# Patient Record
Sex: Male | Born: 1965 | Race: Black or African American | Hispanic: No | Marital: Single | Smoking: Current every day smoker
Health system: Southern US, Community
[De-identification: ages and names within clinical notes are randomized; demographics above are authoritative.]

---

## 2013-03-27 ENCOUNTER — Encounter (HOSPITAL_COMMUNITY): Payer: Self-pay

## 2013-03-27 ENCOUNTER — Emergency Department (INDEPENDENT_AMBULATORY_CARE_PROVIDER_SITE_OTHER)
Admission: EM | Admit: 2013-03-27 | Discharge: 2013-03-27 | Disposition: A | Discharge status: Home / Self Care | Payer: Self-pay | Source: Home / Self Care | Attending: Family Medicine | Admitting: Family Medicine

## 2013-03-27 DIAGNOSIS — R040 Epistaxis: Secondary | ICD-10-CM | POA: Diagnosis present

## 2013-03-27 DIAGNOSIS — J309 Allergic rhinitis, unspecified: Secondary | ICD-10-CM | POA: Diagnosis present

## 2013-03-27 MED ORDER — CETIRIZINE HCL 10 MG PO TABS: 10.0000 mg | ORAL_TABLET | Freq: Every day | ORAL | Status: DC

## 2013-03-27 MED ORDER — SODIUM CHLORIDE 0.65 % NA SOLN: 2.0000 | NASAL | Status: DC | PRN

## 2013-03-27 NOTE — ED Provider Notes (Signed)
History     CSN: [id number]  Arrival date & time 03/27/13  1222   First MD Initiated Contact with Patient 03/27/13 1409      Chief Complaint  Patient presents with  . Epistaxis    (Consider location/radiation/quality/duration/timing/severity/associated sxs/prior treatment) HPI Pt presenting today reporting that he is having nose bleeds.  He says that he has had 2 other episodes.  Pt says that it happened last month.  He says that he had a nose bleed this morning.  Pt says that he was moving some furniture around when it happened.    History reviewed. No pertinent past medical history.  History reviewed. No pertinent past surgical history.  FH Mother had HTN, or DM   History  Substance Use Topics  . Smoking status: Not on file  . Smokeless tobacco: Not on file  . Alcohol Use: Not on file    Review of Systems Constitutional: Negative.  HENT: nose bleeding  Respiratory: Negative.  Cardiovascular: Negative.  Gastrointestinal: Negative.  Endocrine: Negative.  Genitourinary: Negative.  Musculoskeletal: Negative.  Skin: Negative.  Allergic/Immunologic: Negative.  Neurological: Negative.  Hematological: Negative.  Psychiatric/Behavioral: Negative.  All other systems reviewed and are negative   Allergies  Review of patient's allergies indicates no known allergies.  Home Medications  No current outpatient prescriptions on file.  BP 113/63  Pulse 69  Temp(Src) 97.9 F (36.6 C) (Oral)  Resp 17  SpO2 99%  Physical Exam Nursing note and vitals reviewed.  Constitutional: He is oriented to person, place, and time. He appears well-developed and well-nourished. No distress.  Eyes: Conjunctivae and EOM are normal. Pupils are equal, round, and reactive to light.  Neck: Normal range of motion. Neck supple. No JVD present. No thyromegaly present.  Cardiovascular: Normal rate, regular rhythm and normal heart sounds.  No murmur heard.  Pulmonary/Chest: Effort normal and  breath sounds normal. No respiratory distress.  Abdominal: Soft. Bowel sounds are normal.  Musculoskeletal: Normal range of motion. He exhibits no edema.  Lymphadenopathy:  He has no cervical adenopathy.  Neurological: He is oriented to person, place, and time. Coordination normal.  Skin: Skin is warm and dry. No rash noted. No erythema. No pallor.  Psychiatric: He has a normal mood and affect. His behavior is normal. Judgment and thought content normal.   ED Course  Procedures (including critical care time)  Labs Reviewed - No data to display No results found.   No diagnosis found.   MDM  IMPRESSION  Epistaxis  Allergic rhinitis  RECOMMENDATIONS / PLAN Trial of saline nasal spray bid to nares Zyrtec 10 mg po daily  RTC if persists.  FOLLOW UP As needed   The patient was given clear instructions to go to ER or return to medical center if symptoms don't improve, worsen or new problems develop.  The patient verbalized understanding.  The patient was told to call to get lab results if they haven't heard anything in the next week.            Cleora Fleet, MD 03/27/13 1416

## 2013-03-27 NOTE — ED Notes (Signed)
Patient here for chronic nose bleeds Has had two prior to this one Denies having allergies

## 2013-06-18 ENCOUNTER — Other Ambulatory Visit: Payer: Self-pay | Admitting: Internal Medicine

## 2013-06-18 ENCOUNTER — Ambulatory Visit: Payer: No Typology Code available for payment source | Attending: Family Medicine | Admitting: Internal Medicine

## 2013-06-18 ENCOUNTER — Ambulatory Visit
Admission: RE | Admit: 2013-06-18 | Discharge: 2013-06-18 | Disposition: A | Discharge status: Home / Self Care | Payer: Self-pay | Source: Ambulatory Visit | Attending: Internal Medicine | Admitting: Internal Medicine

## 2013-06-18 VITALS — BP 107/68 | HR 70 | Temp 98.6°F | Resp 18 | Wt 188.0 lb

## 2013-06-18 DIAGNOSIS — K4090 Unilateral inguinal hernia, without obstruction or gangrene, not specified as recurrent: Secondary | ICD-10-CM

## 2013-06-18 NOTE — Progress Notes (Signed)
Patient here for hernia near his groin

## 2013-06-18 NOTE — Patient Instructions (Signed)
Did not lift anything more than 25 pounds

## 2013-06-18 NOTE — Progress Notes (Signed)
Patient ID: Timothy Buckley, male   DOB: 1966-09-23, 47 y.o.   MRN: [id number]  CC:  HPI: 47 year old male who presents with right groin pain. The patient describes the discomfort as being sharp as if somebody jabbing a pin  in his groin area, the discomfort is intermittent, Patient works as a Electronics engineer. He denies any fever, dysuria, urgency frequency     No Known Allergies History reviewed. No pertinent past medical history. Current Outpatient Prescriptions on File Prior to Visit  Medication Sig Dispense Refill  . cetirizine (ZYRTEC) 10 MG tablet Take 1 tablet (10 mg total) by mouth daily.  30 tablet  2  . sodium chloride (OCEAN) 0.65 % nasal spray Place 2 sprays into the nose as needed (dry nasal cavity).  15 mL  5   No current facility-administered medications on file prior to visit.   History reviewed. No pertinent family history. History   Social History  . Marital Status: Single    Spouse Name: N/A    Number of Children: N/A  . Years of Education: N/A   Occupational History  . Not on file.   Social History Main Topics  . Smoking status: Not on file  . Smokeless tobacco: Not on file  . Alcohol Use: Not on file  . Drug Use: Not on file  . Sexually Active: Not on file   Other Topics Concern  . Not on file   Social History Narrative  . No narrative on file    Review of Systems  Constitutional: Negative for fever, chills, diaphoresis, activity change, appetite change and fatigue.  HENT: Negative for ear pain, nosebleeds, congestion, facial swelling, rhinorrhea, neck pain, neck stiffness and ear discharge.   Eyes: Negative for pain, discharge, redness, itching and visual disturbance.  Respiratory: Negative for cough, choking, chest tightness, shortness of breath, wheezing and stridor.   Cardiovascular: Negative for chest pain, palpitations and leg swelling.  Gastrointestinal: Negative for abdominal distention.  Genitourinary: Negative for  dysuria, urgency, frequency, hematuria, flank pain, decreased urine volume, difficulty urinating and dyspareunia.  Musculoskeletal: Negative for back pain, joint swelling, arthralgias and gait problem.  Neurological: Negative for dizziness, tremors, seizures, syncope, facial asymmetry, speech difficulty, weakness, light-headedness, numbness and headaches.  Hematological: Negative for adenopathy. Does not bruise/bleed easily.  Psychiatric/Behavioral: Negative for hallucinations, behavioral problems, confusion, dysphoric mood, decreased concentration and agitation.    Objective:   Filed Vitals:   06/18/13 1201  BP: 107/68  Pulse: 70  Temp: 98.6 F (37 C)  Resp: 18    Physical Exam  Constitutional: Appears well-developed and well-nourished. No distress.  HENT: Normocephalic. External right and left ear normal. Oropharynx is clear and moist.  Eyes: Conjunctivae and EOM are normal. PERRLA, no scleral icterus.  Neck: Normal ROM. Neck supple. No JVD. No tracheal deviation. No thyromegaly.  CVS: RRR, S1/S2 +, no murmurs, no gallops, no carotid bruit.  Pulmonary: Effort and breath sounds normal, no stridor, rhonchi, wheezes, rales.  Abdominal: Soft. BS +,  no distension, tenderness, rebound or guarding.  Musculoskeletal: Normal range of motion. No edema and no tenderness.  Lymphadenopathy: No lymphadenopathy noted, cervical, inguinal. Neuro: Alert. Normal reflexes, muscle tone coordination. No cranial nerve deficit. Skin: Skin is warm and dry. No rash noted. Not diaphoretic. No erythema. No pallor.  Psychiatric: Normal mood and affect. Behavior, judgment, thought content normal.   No results found for this basename: WBC, HGB, HCT, MCV, PLT   No results found for this basename:  CREATININE, BUN, NA, K, CL, CO2    No results found for this basename: HGBA1C   Lipid Panel  No results found for this basename: chol, trig, hdl, cholhdl, vldl, ldlcalc       Assessment and plan:   Patient  Active Problem List   Diagnosis Date Noted  . Allergic rhinitis 03/27/2013  . Epistaxis, recurrent 03/27/2013       Inguinal hernia Surgical referral Ultrasound of inguinal  area Limitation on lifting weights more than 25 pounds Patient is waiting for his orange card I have recommended that he work with Arna Medici for his surgical appointment We'll also check a UA No further followup needed unless the patient develops acute pain and this was explained to the patient

## 2013-06-19 LAB — URINALYSIS
Bilirubin Urine: NEGATIVE
Glucose, UA: NEGATIVE mg/dL
Ketones, ur: NEGATIVE mg/dL
Specific Gravity, Urine: 1.03 (ref 1.005–1.030)

## 2013-06-26 ENCOUNTER — Ambulatory Visit (INDEPENDENT_AMBULATORY_CARE_PROVIDER_SITE_OTHER): Payer: Self-pay | Admitting: Surgery

## 2013-06-26 ENCOUNTER — Ambulatory Visit: Payer: Self-pay

## 2013-07-07 ENCOUNTER — Ambulatory Visit (INDEPENDENT_AMBULATORY_CARE_PROVIDER_SITE_OTHER): Payer: Self-pay | Admitting: Surgery

## 2013-07-09 ENCOUNTER — Ambulatory Visit (INDEPENDENT_AMBULATORY_CARE_PROVIDER_SITE_OTHER): Payer: Self-pay | Admitting: Surgery

## 2013-07-09 ENCOUNTER — Encounter (INDEPENDENT_AMBULATORY_CARE_PROVIDER_SITE_OTHER): Payer: Self-pay | Admitting: Surgery

## 2013-07-09 VITALS — BP 124/72 | HR 68 | Temp 98.2°F | Resp 14 | Ht 69.0 in | Wt 192.2 lb

## 2013-07-09 DIAGNOSIS — K429 Umbilical hernia without obstruction or gangrene: Secondary | ICD-10-CM

## 2013-07-09 DIAGNOSIS — K4090 Unilateral inguinal hernia, without obstruction or gangrene, not specified as recurrent: Secondary | ICD-10-CM

## 2013-07-09 NOTE — Progress Notes (Signed)
Patient ID: Timothy Buckley, male   DOB: 1966/04/01, 47 y.o.   MRN: [id number]  Chief Complaint  Patient presents with  . New Evaluation    eval ing hernia on right    HPI Timothy Buckley is a 47 y.o. male.  Referred by  Dr. Susie Cassette for evaluation of right inguinal hernia HPI This is a 47 year old male who presents with a five-year history of a palpable bulge in his right groin. This initially was noticed when he was incarcerated and was doing a lot of weight lifting. He remains reducible until the last year. Has become more prominent and extends down into the scrotum. It causes some discomfort. Occasionally he does have some difficulty with urination.  Currently he is living in a group home and claims to have given up smoking, alcohol, and other drugs. Due to the increasing discomfort he is now referred for surgical evaluation.   History reviewed. No pertinent past medical history.  History reviewed. No pertinent past surgical history.  Family History  Problem Relation Age of Onset  . Diabetes Mother   . Heart disease Mother   . Hypertension Mother     Social History History  Substance Use Topics  . Smoking status: Former Smoker    Quit date: 01/08/2013  . Smokeless tobacco: Never Used  . Alcohol Use: No    No Known Allergies  No current outpatient prescriptions on file.   No current facility-administered medications for this visit.    Review of Systems Review of Systems  Constitutional: Negative for fever, chills and unexpected weight change.  HENT: Negative for hearing loss, congestion, sore throat, trouble swallowing and voice change.   Eyes: Negative for visual disturbance.  Respiratory: Negative for cough and wheezing.   Cardiovascular: Negative for chest pain, palpitations and leg swelling.  Gastrointestinal: Negative for nausea, vomiting, abdominal pain, diarrhea, constipation, blood in stool, abdominal distention, anal bleeding and rectal pain.  Genitourinary:  Positive for urgency, scrotal swelling and testicular pain. Negative for hematuria and difficulty urinating.  Musculoskeletal: Negative for arthralgias.  Skin: Negative for rash and wound.  Neurological: Negative for seizures, syncope, weakness and headaches.  Hematological: Negative for adenopathy. Does not bruise/bleed easily.  Psychiatric/Behavioral: Negative for confusion.    Blood pressure 124/72, pulse 68, temperature 98.2 F (36.8 C), temperature source Temporal, resp. rate 14, height 5\' 9"  (1.753 m), weight 192 lb 3.2 oz (87.181 kg).  Physical Exam Physical Exam WDWN in NAD HEENT:  EOMI, sclera anicteric Neck:  No masses, no thyromegaly Lungs:  CTA bilaterally; normal respiratory effort CV:  Regular rate and rhythm; no murmurs Abd:  +bowel sounds, soft, non-tender, protruding reducible umbilical hernia GU:  Bilateral descended testes; no testicular masses; large right scrotal hernia; some laxity in left groin but no obvious left inguinal hernia Ext:  Well-perfused; no edema Skin:  Warm, dry; no sign of jaundice  Data Reviewed none  Assessment    Right inguinal hernia - partially reducible Umbilical hernia reducible     Plan    Right inguinal hernia repair with mesh/ umbilical hernia repair with mesh.  The surgical procedure has been discussed with the patient.  Potential risks, benefits, alternative treatments, and expected outcomes have been explained.  All of the patient's questions at this time have been answered.  The likelihood of reaching the patient's treatment goal is good.  The patient understand the proposed surgical procedure and wishes to proceed.         Timothy Buckley K. 07/09/2013, 12:38  PM

## 2013-07-15 ENCOUNTER — Telehealth (INDEPENDENT_AMBULATORY_CARE_PROVIDER_SITE_OTHER): Payer: Self-pay | Admitting: Surgery

## 2013-07-15 NOTE — Telephone Encounter (Signed)
Advised patient and Mr. Timothy Buckley of financial responsibility for surgical procedure, per patient they will call back to schedule.

## 2013-07-30 ENCOUNTER — Emergency Department (INDEPENDENT_AMBULATORY_CARE_PROVIDER_SITE_OTHER)
Admission: EM | Admit: 2013-07-30 | Discharge: 2013-07-30 | Disposition: A | Discharge status: Home / Self Care | Payer: No Typology Code available for payment source | Source: Home / Self Care | Attending: Emergency Medicine | Admitting: Emergency Medicine

## 2013-07-30 ENCOUNTER — Encounter (HOSPITAL_COMMUNITY): Payer: Self-pay | Admitting: Emergency Medicine

## 2013-07-30 DIAGNOSIS — D369 Benign neoplasm, unspecified site: Secondary | ICD-10-CM

## 2013-07-30 NOTE — ED Notes (Signed)
Patient asked about work note, located note for patient

## 2013-07-30 NOTE — ED Notes (Signed)
C/o intermittent nose bleed.  Patient is staying at [facility] house.  No known injury.  Nose bleeds for 3 weeks

## 2013-07-30 NOTE — ED Provider Notes (Signed)
Chief Complaint:   Chief Complaint  Patient presents with  . Epistaxis    History of Present Illness:   Timothy Buckley is a 47 year old male who has had a three-week history of a sensation of swelling, intermittent bleeding, and soreness in the right nostril. He denies any trauma to the area. The nose has felt stopped up. It's been oozing some blood and serous fluid. He's had no heavy bleeding. He denies any fever, chills, headache, sore throat, intraoral lesions, adenopathy, or cough.  Review of Systems:  Other than noted above, the patient denies any of the following symptoms: Systemic:  No fevers, chills, sweats, weight loss or gain, fatigue, or tiredness. Eye:  No redness, pain, discharge, itching, blurred vision, or diplopia. ENT:  No headache, nasal congestion, sneezing, itching, epistaxis, ear pain, congestion, decreased hearing, ringing in ears, vertigo, or tinnitus.  No oral lesions, sore throat, pain on swallowing, or hoarseness. Neck:  No mass, tenderness or adenopathy. Lungs:  No coughing, wheezing, or shortness of breath. Skin:  No rash or itching.  PMFSH:  Past medical history, family history, social history, meds, and allergies were reviewed.   Physical Exam:   Vital signs:  BP 119/74  Pulse 90  Temp(Src) 97.8 F (36.6 C) (Oral)  Resp 16  SpO2 98% General:  Alert and oriented.  In no distress.  Skin warm and dry. Eye:  PERRL, full EOMs, lids and conjunctiva normal.   ENT:  TMs and canals clear.  There is a large, reddish-brown mass in the right nostril, occluding the entire nostril. The surface was friable and oozing a small amount of blood, this was not bleeding heavily.  Mucous membranes moist, no oral lesions, normal dentition, pharynx clear.  No cranial or facial pain to palplation. Neck:  Supple, full ROM.  No adenopathy, tenderness or mass.  Thyroid normal. Lungs:  Breath sounds clear and equal bilaterally.  No wheezes, rales or rhonchi. Heart:  Rhythm regular,  without extrasystoles.  No gallops or murmers. Skin:  Clear, warm and dry.  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 He needs to be evaluated by ENT as soon as possible. Dr. Pollyann Kennedy in his her on-call physician for ENT for today. I called his office and fortunately were able to see him right away. He was sent over to Dr. Lucky Rathke office for immediate followup.  Plan:   1.  The following meds were prescribed:  There are no discharge medications for this patient.  2.  The patient was instructed in symptomatic care and handouts were given. 3.  The patient was told to return if becoming worse in any way, if no better in 3 or 4 days, and given some red flag symptoms such as heavy bleeding that would indicate earlier return. 4.  Follow up with Dr. Pollyann Kennedy as soon as possible.    Reuben Likes, MD 07/30/13 [phone]

## 2013-08-19 ENCOUNTER — Ambulatory Visit: Payer: Self-pay

## 2015-04-06 ENCOUNTER — Ambulatory Visit: Payer: Self-pay | Admitting: Infectious Diseases

## 2015-08-15 ENCOUNTER — Emergency Department (HOSPITAL_COMMUNITY): Payer: Self-pay

## 2015-08-15 ENCOUNTER — Encounter (HOSPITAL_COMMUNITY): Payer: Self-pay | Admitting: Emergency Medicine

## 2015-08-15 ENCOUNTER — Emergency Department (HOSPITAL_COMMUNITY)
Admission: EM | Admit: 2015-08-15 | Discharge: 2015-08-15 | Disposition: A | Discharge status: Home / Self Care | Payer: Self-pay | Attending: Emergency Medicine | Admitting: Emergency Medicine

## 2015-08-15 DIAGNOSIS — J069 Acute upper respiratory infection, unspecified: Secondary | ICD-10-CM | POA: Insufficient documentation

## 2015-08-15 DIAGNOSIS — M25511 Pain in right shoulder: Secondary | ICD-10-CM | POA: Insufficient documentation

## 2015-08-15 DIAGNOSIS — R911 Solitary pulmonary nodule: Secondary | ICD-10-CM | POA: Insufficient documentation

## 2015-08-15 DIAGNOSIS — Z720 Tobacco use: Secondary | ICD-10-CM | POA: Insufficient documentation

## 2015-08-15 MED ORDER — GUAIFENESIN-CODEINE 100-10 MG/5ML PO SOLN: 5.0000 mL | Freq: Four times a day (QID) | ORAL | Status: AC | PRN

## 2015-08-15 MED ORDER — NAPROXEN 375 MG PO TABS: 375.0000 mg | ORAL_TABLET | Freq: Two times a day (BID) | ORAL | Status: AC

## 2015-08-15 NOTE — ED Notes (Signed)
Pt arrives POV from home with right shoulder pain with movement. Denies recent injury. 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 Reports occasional chest pains with coughing. Denies recent fever. Vss.

## 2015-08-15 NOTE — ED Provider Notes (Signed)
CSN: [id number]     Arrival date & time 08/15/15  3267 History   First MD Initiated Contact with Patient 08/15/15 1037     Chief Complaint  Patient presents with  . Arm Pain  . Cough     (Consider location/radiation/quality/duration/timing/severity/associated sxs/prior Treatment) HPI  Timothy Buckley Is a 49 year old male who presents emergency Department with 2 chief complaints.  1. Patient complains of cough which began 3 days ago. He complains of pain in the upper chest with cough only. He has mild production of clear phlegm. He denies shortness of breath, chest pressure, diaphoresis, dyspnea on exertion or other symptoms of acute coronary syndrome. He denies hemoptysis. His girlfriend who is present at the same symptoms 3 weeks ago. The patient is a current daily smoker. He has not tried any medications to treat his cough. 2. Patient complains of right shoulder pain which has been present for greater than one month. Patient moves furniture for a living. He denies any previous injuries. He complains of pain with flexion and abduction of the right shoulder. It has progressively worsened over the past month. It occasionally wakes him from sleep. He is having difficulty performing his work activities. He denies any numbness or tingling in his hand. He has not tried any medications to treat his pain.   History reviewed. No pertinent past medical history. History reviewed. No pertinent past surgical history. Family History  Problem Relation Age of Onset  . Diabetes Mother   . Heart disease Mother   . Hypertension Mother    Social History  Substance Use Topics  . Smoking status: Former Smoker    Quit date: 01/08/2013  . Smokeless tobacco: Never Used  . Alcohol Use: No    Review of Systems  Ten systems reviewed and are negative for acute change, except as noted in the HPI.    Allergies  Review of patient's allergies indicates no known allergies.  Home Medications   Prior to  Admission medications   Not on File   BP 122/89 mmHg  Pulse 69  Temp(Src) 97.7 F (36.5 C) (Oral)  Resp 17  Ht 5\' 9"  (1.753 m)  Wt 185 lb (83.915 kg)  BMI 27.31 kg/m2  SpO2 98% Physical Exam  Constitutional: He is oriented to person, place, and time. He appears well-developed and well-nourished. No distress.  HENT:  Head: Normocephalic and atraumatic.  Eyes: Conjunctivae are normal. No scleral icterus.  Neck: Normal range of motion. Neck supple.  Cardiovascular: Normal rate, regular rhythm and normal heart sounds.   Pulmonary/Chest: Effort normal and breath sounds normal. No respiratory distress.  Abdominal: Soft. There is no tenderness.  Musculoskeletal: He exhibits no edema.  Pain with active range of motion with abduction and hyperflexion of the right shoulder. Patient unable to relax enough to let me perform passive range of motion. He is equal and strong grip strength bilaterally. Radial pulses are 2+ bilaterally with normal sensation. No bony tenderness.  Neurological: He is alert and oriented to person, place, and time.  Skin: Skin is warm and dry. He is not diaphoretic.  Psychiatric: His behavior is normal.  Nursing note and vitals reviewed.   ED Course  Procedures (including critical care time) Labs Review Labs Reviewed - No data to display  Imaging Review No results found. I have personally reviewed and evaluated these images and lab results as part of my medical decision-making.   EKG Interpretation   Date/Time:  Monday August 15 2015 10:12:59 EDT Ventricular Rate:  65 PR Interval:  170 QRS Duration: 88 QT Interval:  396 QTC Calculation: 411 R Axis:   -66 Text Interpretation:  Normal sinus rhythm Left axis deviation Cannot rule  out Anterior infarct , age undetermined Abnormal ECG Confirmed by [facility]  MD, Corene Cornea [phone]) on 08/15/2015 10:47:48 AM      MDM   Final diagnoses:  Right shoulder pain  URI (upper respiratory infection)  Incidental pulmonary  nodule, > 37mm and < 49mm  Tobacco abuse    10:59 AM BP 122/89 mmHg  Pulse 69  Temp(Src) 97.7 F (36.5 C) (Oral)  Resp 17  Ht 5\' 9"  (1.753 m)  Wt 185 lb (83.915 kg)  BMI 27.31 kg/m2  SpO2 98% Patient here with complaint of chest pain with cough and right shoulder pain. EKG was obtained at triage prior to my medical decision-making. I do not feel EKG was a necessary component of this patient's workup. Given his complaint of chest pain with cough as no concern for acute coronary syndrome. However, the patient's EKG does show some ST depression in V1 and V2. I have discussed this with Dr. Dayna Barker feels we will need a repeat EKG during this visit.  Patient EKG repeated and has improved. Patient shoulder x-ray negative. For this likely represents a tendinitis or other soft tissue injury of the shoulder, likely from overuse. Patient will be referred to orthopedics for further evaluation, treatment with rice protocol, and anti-inflammatory medications. Patient chest x-ray negative except for incidental 7 mm pulmonary nodule. Discussed this with the patient and recommended repeat chest CT in 6 months. Patient aware of the findings. He appears safe for discharge at this time. Pt CXR negative for acute infiltrate. Patients symptoms are consistent with URI, likely viral etiology. Discussed that antibiotics are not indicated for viral infections. Pt will be discharged with symptomatic treatment.  Verbalizes understanding and is agreeable with plan. Pt is hemodynamically stable & in NAD prior to dc.       Margarita Mail, PA-C 08/16/15 2225  Merrily Pew, MD 08/18/15 [phone]

## 2015-08-15 NOTE — Discharge Instructions (Signed)
your chest x-ray was negative except for an incidental finding of a 7 mm pulmonary nodule. Most of these are not dangerous. However, because you smoke. You will need a repeat chest CT scan in 6 months to make sure that it is not changing or growing. [facility] for further management of her symptoms. I have given you a referral to orthopedics. Please take the medication. I am giving her as prescribed. Ice 3-4 times daily as we discussed. He may use her sling for comfort. He will need to take her arm out and move it several times daily.   Pulmonary Nodule A pulmonary nodule is a small, round growth of tissue in the lung. Pulmonary nodules can range in size from less than 1/5 inch (4 mm) to a little bigger than an inch (25 mm). Most pulmonary nodules are detected when imaging tests of the lung are being performed for a different problem. Pulmonary nodules are usually not cancerous (benign). However, some pulmonary nodules are cancerous (malignant). Follow-up treatment or testing is based on the size of the pulmonary nodule and your risk of getting lung cancer.  CAUSES Benign pulmonary nodules can be caused by various things. Some of the causes include:   Bacterial, fungal, or viral infections. This is usually an old infection that is no longer active, but it can sometimes be a current, active infection.  A benign mass of tissue.  Inflammation from conditions such as rheumatoid arthritis.   Abnormal blood vessels in the lungs. Malignant pulmonary nodules can result from lung cancer or from cancers that spread to the lung from other places in the body. SIGNS AND SYMPTOMS Pulmonary nodules usually do not cause symptoms. DIAGNOSIS Most often, pulmonary nodules are found incidentally when an X-ray or CT scan is performed to look for some other problem in the lung area. To help determine whether a pulmonary nodule is benign or malignant, your  health care provider will take a medical history and order a variety of tests. Tests done may include:   Blood tests.  A skin test called a tuberculin test. This test is used to determine if you have been exposed to the germ that causes tuberculosis.   Chest X-rays. If possible, a new X-ray may be compared with X-rays you have had in the past.   CT scan. This test shows smaller pulmonary nodules more clearly than an X-ray.   Positron emission tomography (PET) scan. In this test, a safe amount of a radioactive substance is injected into the bloodstream. Then, the scan takes a picture of the pulmonary nodule. The radioactive substance is eliminated from your body in your urine.   Biopsy. A tiny piece of the pulmonary nodule is removed so it can be checked under a microscope. TREATMENT  Pulmonary nodules that are benign normally do not require any treatment because they usually do not cause symptoms or breathing problems. Your health care provider may want to monitor the pulmonary nodule through follow-up CT scans. The frequency of these CT scans will vary based on the size of the nodule and the risk factors for lung cancer. For example, CT scans will need to be done more frequently if the pulmonary nodule is larger and if you have a history of smoking and a family history of cancer. Further testing or biopsies may be done if any follow-up CT scan shows that the size of the pulmonary nodule has increased. HOME CARE INSTRUCTIONS  Only take over-the-counter or prescription medicines as directed by your health care provider.  Keep all follow-up appointments with your health care provider. SEEK MEDICAL CARE IF:  You have trouble breathing when you are active.   You feel sick or unusually tired.   You do not feel like eating.   You lose weight without trying to.   You develop chills or night sweats.  SEEK IMMEDIATE MEDICAL CARE IF:  You cannot catch your breath, or you begin  wheezing.   You cannot stop coughing.   You cough up blood.   You become dizzy or feel like you are going to pass out.   You have sudden chest pain.   You have a fever or persistent symptoms for more than 2-3 days.   You have a fever and your symptoms suddenly get worse. MAKE SURE YOU:  Understand these instructions.  Will watch your condition.  Will get help right away if you are not doing well or get worse. Document Released: 09/30/2009 Document Revised: 08/05/2013 Document Reviewed: 05/25/2013 [facility] Patient Information 2015 [place], [place]. This information is not intended to replace advice given to you by your health care provider. Make sure you discuss any questions you have with your health care provider.  Shoulder Pain The shoulder is the joint that connects your arms to your body. The bones that form the shoulder joint include the upper arm bone (humerus), the shoulder blade (scapula), and the collarbone (clavicle). The top of the humerus is shaped like a ball and fits into a rather flat socket on the scapula (glenoid cavity). A combination of muscles and strong, fibrous tissues that connect muscles to bones (tendons) support your shoulder joint and hold the ball in the socket. Small, fluid-filled sacs (bursae) are located in different areas of the joint. They act as cushions between the bones and the overlying soft tissues and help reduce friction between the gliding tendons and the bone as you move your arm. Your shoulder joint allows a wide range of motion in your arm. This range of motion allows you to do things like scratch your back or throw a ball. However, this range of motion also makes your shoulder more prone to pain from overuse and injury. Causes of shoulder pain can originate from both injury and overuse and usually can be grouped in the following four categories:  Redness, swelling, and pain (inflammation) of the tendon (tendinitis) or the bursae  (bursitis).  Instability, such as a dislocation of the joint.  Inflammation of the joint (arthritis).  Broken bone (fracture). HOME CARE INSTRUCTIONS   Apply ice to the sore area.  Put ice in a plastic bag.  Place a towel between your skin and the bag.  Leave the ice on for 15-20 minutes, 3-4 times per day for the first 2 days, or as directed by your health care provider.  Stop using cold packs if they do not help with the pain.  If you have a shoulder sling or immobilizer, wear it as long as your caregiver instructs. Only remove it to shower or bathe. Move your arm as little as possible, but keep your hand moving to prevent swelling.  Squeeze a soft ball or foam pad as much as possible to help prevent swelling.  Only take over-the-counter or prescription medicines for pain, discomfort, or fever as directed by your caregiver. SEEK MEDICAL CARE IF:   Your shoulder pain increases, or new pain develops in your arm, hand, or fingers.  Your hand  or fingers become cold and numb.  Your pain is not relieved with medicines. SEEK IMMEDIATE MEDICAL CARE IF:   Your arm, hand, or fingers are numb or tingling.  Your arm, hand, or fingers are significantly swollen or turn white or blue. MAKE SURE YOU:   Understand these instructions.  Will watch your condition.  Will get help right away if you are not doing well or get worse. Document Released: 09/12/2005 Document Revised: 04/19/2014 Document Reviewed: 11/17/2011 [facility] Patient Information 2015 [place], [place]. This information is not intended to replace advice given to you by your health care provider. Make sure you discuss any questions you have with your health care provider.  Shoulder Range of Motion Exercises The shoulder is the most flexible joint in the human body. Because of this it is also the most unstable joint in the body. All ages can develop shoulder problems. Early treatment of problems is necessary for a good  outcome. People react to shoulder pain by decreasing the movement of the joint. After a brief period of time, the shoulder can become "frozen". This is an almost complete loss of the ability to move the damaged shoulder. Following injuries your caregivers can give you instructions on exercises to keep your range of motion (ability to move your shoulder freely), or regain it if it has been lost.  [place]: Codman's Exercise or Pendulum Exercise  This exercise may be performed in a prone (face-down) lying position or standing while leaning on a chair with the opposite arm. Its purpose is to relax the muscles in your shoulder and slowly but surely increase the range of motion and to relieve pain.  Lie on your stomach close to the side edge of the bed. Let your weak arm hang over the edge of the bed. Relax your shoulder, arm and hand. Let your shoulder blade relax and drop down.  Slowly and gently swing your arm forward and back. Do not use your neck muscles; relax them. It might be easier to have someone else gently start swinging your arm.  As pain decreases, increase your swing. To start, arm swing should begin at 15 degree angles. In time and as pain lessens, move to 30-45 degree angles. Start with swinging for about 15 seconds, and work towards swinging for 3 to 5 minutes.  This exercise may also be performed in a standing/bent over position.  Stand and hold onto a sturdy chair with your good arm. Bend forward at the waist and bend your knees slightly to help protect your back. Relax your weak arm, let it hang limp. Relax your shoulder blade and let it drop.  Keep your shoulder relaxed and use body motion to swing your arm in small circles.  Stand up tall and relax.  Repeat motion and change direction of circles.  Start with swinging for about 30 seconds, and work towards swinging for 3 to 5 minutes. STRETCHING EXERCISES:  Lift your arm  out in front of you with the elbow bent at 90 degrees. Using your other arm gently pull the elbow forward and across your body.  Bend one arm behind you with the palm facing outward. Using the other arm, hold a towel or rope and reach this arm up above your head, then bend it at the elbow to move your wrist to behind your neck. Grab the free end of the towel with the hand behind your back. Gently pull the towel up with the  hand behind your neck, gradually increasing the pull on the hand behind the small of your back. Then, gradually pull down with the hand behind the small of your back. This will pull the hand and arm behind your neck further. Both shoulders will have an increased range of motion with repetition of this exercise. STRENGTHENING EXERCISES:  Standing with your arm at your side and straight out from your shoulder with the elbow bent at 90 degrees, hold onto a small weight and slowly raise your hand so it points straight up in the air. Repeat this five times to begin with, and gradually increase to ten times. Do this four times per day. As you grow stronger you can gradually increase the weight.  Repeat the above exercise, only this time using an elastic band. Start with your hand up in the air and pull down until your hand is by your side. As you grow stronger, gradually increase the amount you pull by increasing the number or size of the elastic bands. Use the same amount of repetitions.  Standing with your hand at your side and holding onto a weight, gradually lift the hand in front of you until it is over your head. Do the same also with the hand remaining at your side and lift the hand away from your body until it is again over your head. Repeat this five times to begin with, and gradually increase to ten times. Do this four times per day. As you grow stronger you can gradually increase the weight. Document Released: 09/01/2003 Document Revised: 12/08/2013 Document Reviewed:  12/03/2005 [facility] Patient Information 2015 [place], [place]. This information is not intended to replace advice given to you by your health care provider. Make sure you discuss any questions you have with your health care provider.  Smoking Cessation, Tips for Success If you are ready to quit smoking, congratulations! You have chosen to help yourself be healthier. Cigarettes bring nicotine, tar, carbon monoxide, and other irritants into your body. Your lungs, heart, and blood vessels will be able to work better without these poisons. There are many different ways to quit smoking. Nicotine gum, nicotine patches, a nicotine inhaler, or nicotine nasal spray can help with physical craving. Hypnosis, support groups, and medicines help break the habit of smoking. WHAT THINGS CAN I DO TO MAKE QUITTING EASIER?  Here are some tips to help you quit for good:  Pick a date when you will quit smoking completely. Tell all of your friends and family about your plan to quit on that date.  Do not try to slowly cut down on the number of cigarettes you are smoking. Pick a quit date and quit smoking completely starting on that day.  Throw away all cigarettes.   Clean and remove all ashtrays from your home, work, and car.  On a card, write down your reasons for quitting. Carry the card with you and read it when you get the urge to smoke.  Cleanse your body of nicotine. Drink enough water and fluids to keep your urine clear or pale yellow. Do this after quitting to flush the nicotine from your body.  Learn to predict your moods. Do not let a bad situation be your excuse to have a cigarette. Some situations in your life might tempt you into wanting a cigarette.  Never have "just one" cigarette. It leads to wanting another and another. Remind yourself of your decision to quit.  Change habits associated with smoking. If you smoked while driving  or when feeling stressed, try other activities to replace smoking.  Stand up when drinking your coffee. Brush your teeth after eating. Sit in a different chair when you read the paper. Avoid alcohol while trying to quit, and try to drink fewer caffeinated beverages. Alcohol and caffeine may urge you to smoke.  Avoid foods and drinks that can trigger a desire to smoke, such as sugary or spicy foods and alcohol.  Ask people who smoke not to smoke around you.  Have something planned to do right after eating or having a cup of coffee. For example, plan to take a walk or exercise.  Try a relaxation exercise to calm you down and decrease your stress. Remember, you may be tense and nervous for the first 2 weeks after you quit, but this will pass.  Find new activities to keep your hands busy. Play with a pen, coin, or rubber band. Doodle or draw things on paper.  Brush your teeth right after eating. This will help cut down on the craving for the taste of tobacco after meals. You can also try mouthwash.   Use oral substitutes in place of cigarettes. Try using lemon drops, carrots, cinnamon sticks, or chewing gum. Keep them handy so they are available when you have the urge to smoke.  When you have the urge to smoke, try deep breathing.  Designate your home as a nonsmoking area.  If you are a heavy smoker, ask your health care provider about a prescription for nicotine chewing gum. It can ease your withdrawal from nicotine.  Reward yourself. Set aside the cigarette money you save and buy yourself something nice.  Look for support from others. Join a support group or smoking cessation program. Ask someone at home or at work to help you with your plan to quit smoking.  Always ask yourself, "Do I need this cigarette or is this just a reflex?" Tell yourself, "Today, I choose not to smoke," or "I do not want to smoke." You are reminding yourself of your decision to quit.  Do not replace cigarette smoking with electronic cigarettes (commonly called e-cigarettes). The  safety of e-cigarettes is unknown, and some may contain harmful chemicals.  If you relapse, do not give up! Plan ahead and think about what you will do the next time you get the urge to smoke. HOW WILL I FEEL WHEN I QUIT SMOKING? You may have symptoms of withdrawal because your body is used to nicotine (the addictive substance in cigarettes). You may crave cigarettes, be irritable, feel very hungry, cough often, get headaches, or have difficulty concentrating. The withdrawal symptoms are only temporary. They are strongest when you first quit but will go away within 10-14 days. When withdrawal symptoms occur, stay in control. Think about your reasons for quitting. Remind yourself that these are signs that your body is healing and getting used to being without cigarettes. Remember that withdrawal symptoms are easier to treat than the major diseases that smoking can cause.  Even after the withdrawal is over, expect periodic urges to smoke. However, these cravings are generally short lived and will go away whether you smoke or not. Do not smoke! WHAT RESOURCES ARE AVAILABLE TO HELP ME QUIT SMOKING? Your health care provider can direct you to community resources or hospitals for support, which may include:  Group support.  Education.  Hypnosis.  Therapy. Document Released: 08/31/2004 Document Revised: 04/19/2014 Document Reviewed: 05/21/2013 [facility] Patient Information 2015 [place], [place]. This information is not intended to replace  advice given to you by your health care provider. Make sure you discuss any questions you have with your health care provider.  Upper Respiratory Infection, Adult An upper respiratory infection (URI) is also known as the common cold. It is often caused by a type of germ (virus). Colds are easily spread (contagious). You can pass it to others by kissing, coughing, sneezing, or drinking out of the same glass. Usually, you get better in 1 or 2 weeks.  HOME CARE   Only  take medicine as told by your doctor.  Use a warm mist humidifier or breathe in steam from a hot shower.  Drink enough water and fluids to keep your pee (urine) clear or pale yellow.  Get plenty of rest.  Return to work when your temperature is back to normal or as told by your doctor. You may use a face mask and wash your hands to stop your cold from spreading. GET HELP RIGHT AWAY IF:   After the first few days, you feel you are getting worse.  You have questions about your medicine.  You have chills, shortness of breath, or brown or red spit (mucus).  You have yellow or brown snot (nasal discharge) or pain in the face, especially when you bend forward.  You have a fever, puffy (swollen) neck, pain when you swallow, or white spots in the back of your throat.  You have a bad headache, ear pain, sinus pain, or chest pain.  You have a high-pitched whistling sound when you breathe in and out (wheezing).  You have a lasting cough or cough up blood.  You have sore muscles or a stiff neck. MAKE SURE YOU:   Understand these instructions.  Will watch your condition.  Will get help right away if you are not doing well or get worse. Document Released: 05/21/2008 Document Revised: 02/25/2012 Document Reviewed: 03/10/2014 [facility] Patient Information 2015 [place], [place]. This information is not intended to replace advice given to you by your health care provider. Make sure you discuss any questions you have with your health care provider.

## 2015-08-15 NOTE — ED Notes (Signed)
Pt transported to xray 

## 2016-11-06 IMAGING — DX DG SHOULDER 2+V*R*
4 series · 4 of 4 positions shown · non-contrast
Comparison: None.

CLINICAL DATA: Right shoulder pain for 3 months with right upper
extremity weakness. No known injury. Initial encounter.

EXAM:
RIGHT SHOULDER - 2+ VIEW

[shoulder grashey]
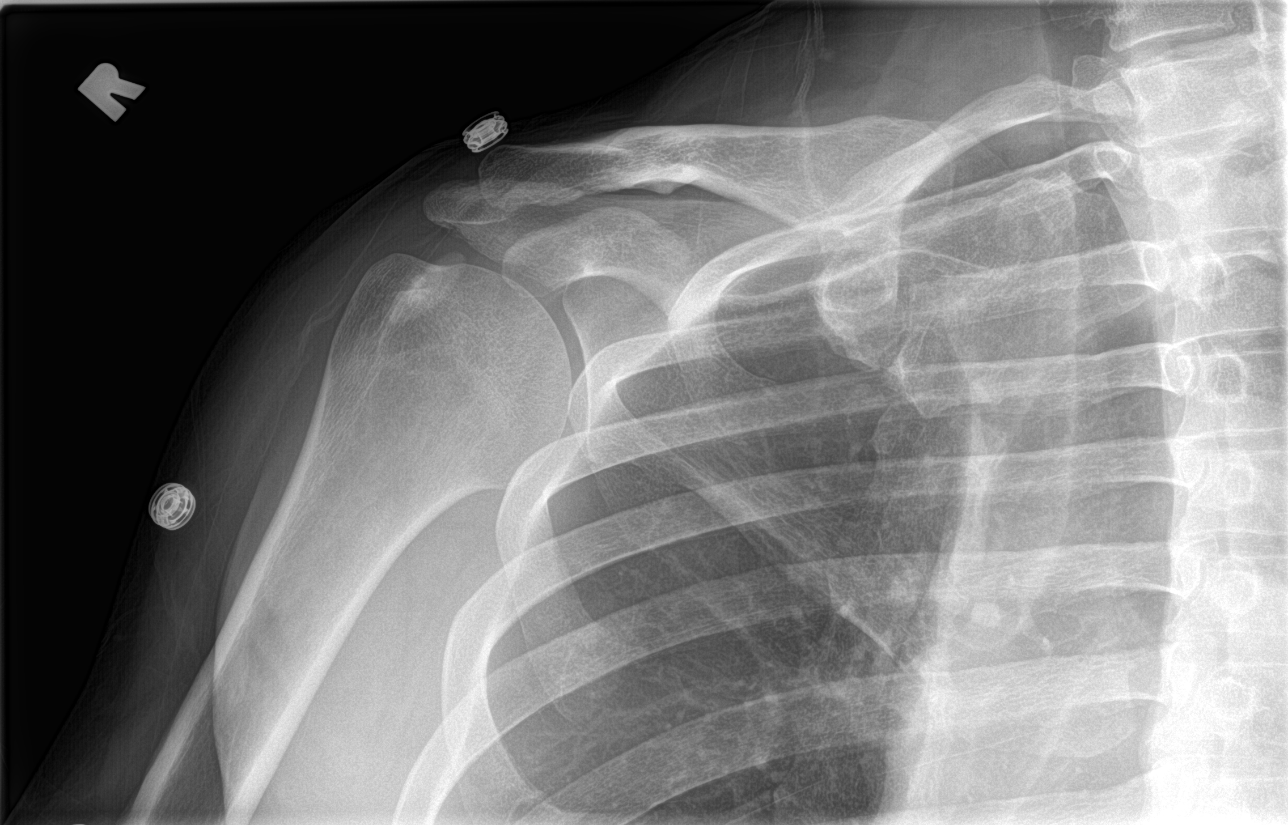

[shoulder y view]
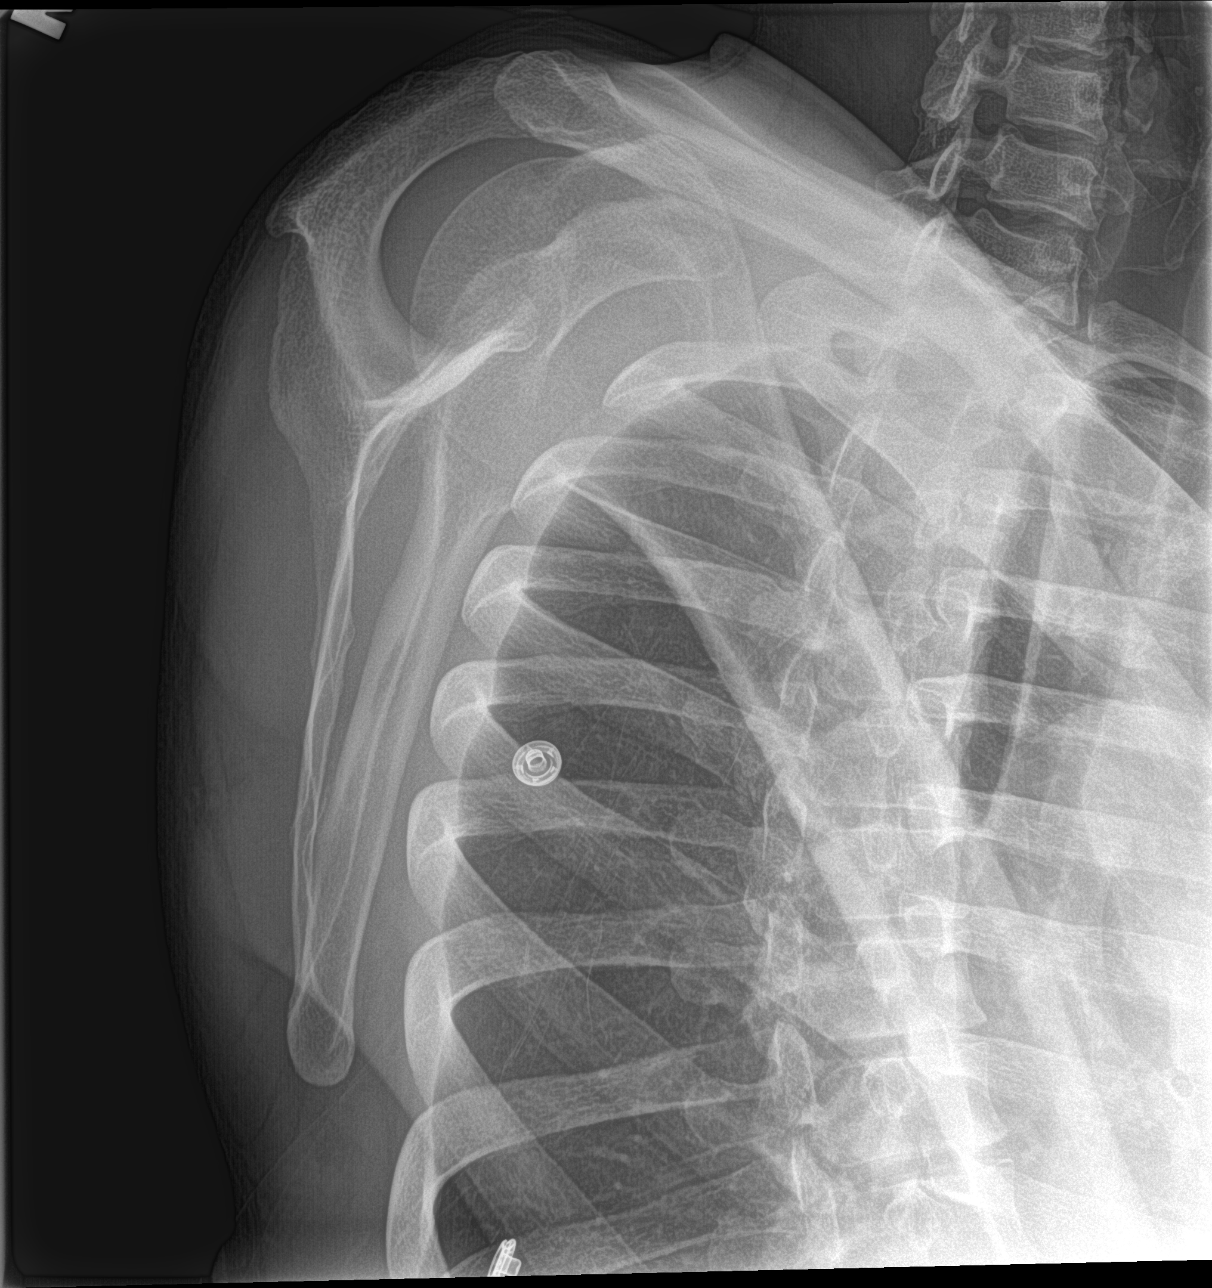

[shoulder axillary]
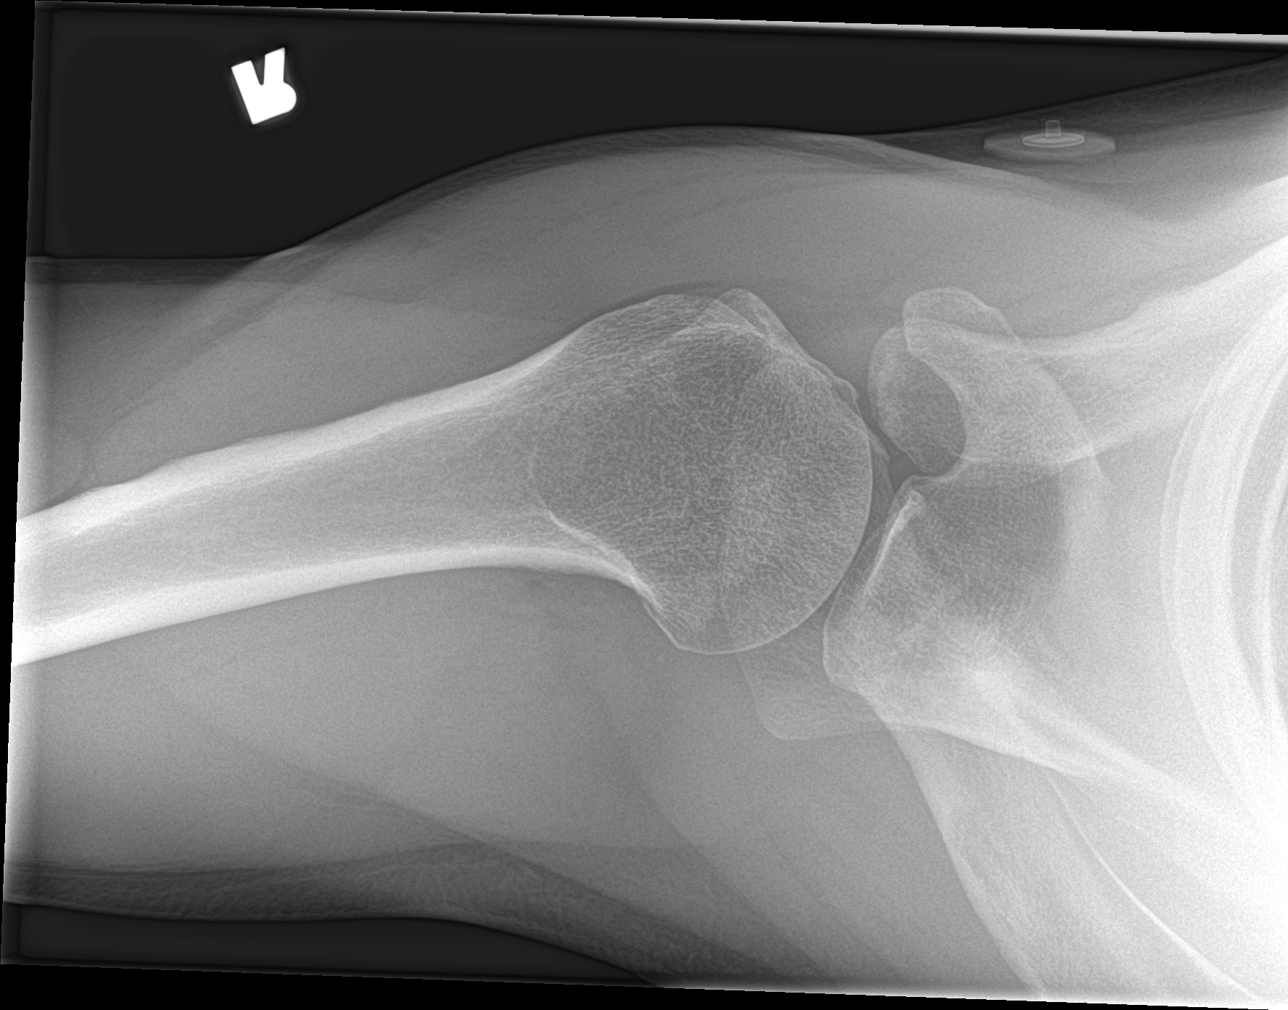

[shoulder ap neutral]
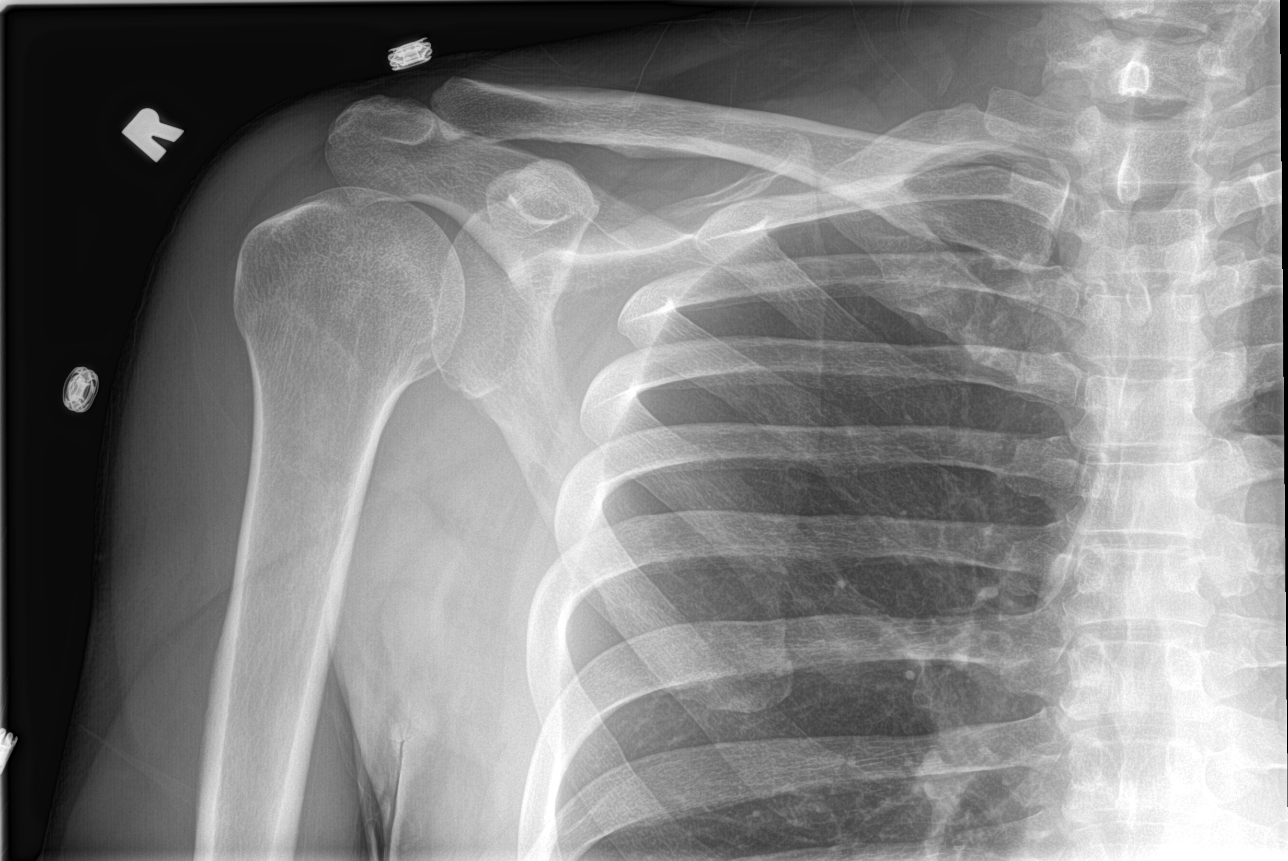

[4 of 4 positions shown; findings below may reference images not displayed]

FINDINGS: There is no evidence of fracture or dislocation. There is no
evidence of arthropathy or other focal bone abnormality. Soft
tissues are unremarkable.
IMPRESSION: Normal exam.

## 2017-03-23 ENCOUNTER — Encounter (HOSPITAL_COMMUNITY): Payer: Self-pay | Admitting: Neurology

## 2017-03-23 ENCOUNTER — Emergency Department (HOSPITAL_COMMUNITY)
Admission: EM | Admit: 2017-03-23 | Discharge: 2017-03-23 | Disposition: A | Discharge status: Home / Self Care | Payer: Self-pay | Attending: Emergency Medicine | Admitting: Emergency Medicine

## 2017-03-23 DIAGNOSIS — T68XXXA Hypothermia, initial encounter: Secondary | ICD-10-CM | POA: Insufficient documentation

## 2017-03-23 DIAGNOSIS — F17200 Nicotine dependence, unspecified, uncomplicated: Secondary | ICD-10-CM | POA: Insufficient documentation

## 2017-03-23 DIAGNOSIS — X31XXXA Exposure to excessive natural cold, initial encounter: Secondary | ICD-10-CM | POA: Insufficient documentation

## 2017-03-23 DIAGNOSIS — R21 Rash and other nonspecific skin eruption: Secondary | ICD-10-CM | POA: Insufficient documentation

## 2017-03-23 MED ORDER — SELENIUM SULFIDE 2.5 % EX LOTN: 1.0000 "application " | TOPICAL_LOTION | Freq: Every day | CUTANEOUS | 12 refills | Status: AC

## 2017-03-23 NOTE — Discharge Instructions (Signed)
As discussed, your evaluation today has been largely reassuring.  But, it is important that you monitor your condition carefully, and do not hesitate to return to the ED if you develop new, or concerning changes in your condition.  Please use the provided prescription for control of your rash. She'll follow-up with our affiliated [facility] in about one week for repeat evaluation.

## 2017-03-23 NOTE — ED Triage Notes (Signed)
Pt was working at [employer], his friends left him and he was walking when he called EMS. He reports feeling cold and weak. BP 112/72, HR 80, RR 16. His clothes are wet.

## 2017-03-23 NOTE — ED Notes (Signed)
Patient given coffee. Remains on bear hugger.

## 2017-03-23 NOTE — ED Notes (Signed)
Dr. Vanita Panda made aware of rectal temp. Bear hugger applied.

## 2017-03-23 NOTE — ED Notes (Signed)
Pt given turkey sandwich and coke 

## 2017-03-23 NOTE — ED Provider Notes (Signed)
[facility] DEPT Provider Note   CSN: [id number] Arrival date & time: 03/23/17  0715     History   Chief Complaint Chief Complaint  Patient presents with  . Cold Exposure    HPI Timothy Buckley is a 51 y.o. male.  HPI  Patient presents after being found cold, shivering. The patient notes that last night, after performing physical labor he had to walk a prolonged distance. It was cold, rainy. Eventually, with persistent discomfort he called EMS. EMS notes that the patient was shivering, uncomfortable on arrival, but hemodynamically stable, awake and alert. Patient denies focal pain, states that he feels very uncomfortable all over. He states he was well prior to last night.   History reviewed. No pertinent past medical history.  Patient Active Problem List   Diagnosis Date Noted  . Right inguinal hernia 07/09/2013  . Umbilical hernia 70/96/2836  . Allergic rhinitis 03/27/2013  . 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 No pertinent surgical history.     Home Medications    Prior to Admission medications   Medication Sig Start Date End Date Taking? Authorizing Provider  guaiFENesin-codeine 100-10 MG/5ML syrup Take 5-10 mLs by mouth every 6 (six) hours as needed for cough. Patient not taking: Reported on 03/23/2017 08/15/15   Margarita Mail, PA-C  naproxen (NAPROSYN) 375 MG tablet Take 1 tablet (375 mg total) by mouth 2 (two) times daily. Patient not taking: Reported on 03/23/2017 08/15/15   Margarita Mail, PA-C  selenium sulfide (SELSUN) 2.5 % shampoo Apply 1 application topically daily. Please apply this medication to areas of rash.  Let the medication sit for about ten minutes, then shower and completely clean the areas. 03/23/17 04/02/17  Carmin Muskrat, MD    Family History Family History  Problem Relation Age of Onset  . Diabetes Mother   . Heart disease Mother   . Hypertension Mother     Social History Social History  Substance Use Topics  .  Smoking status: Current Every Day Smoker    Last attempt to quit: 01/08/2013  . Smokeless tobacco: Never Used  . Alcohol use Yes     Allergies   Patient has no known allergies.   Review of Systems Review of Systems  Constitutional:       Per HPI, otherwise negative  HENT:       Per HPI, otherwise negative  Respiratory:       Per HPI, otherwise negative  Cardiovascular:       Per HPI, otherwise negative  Gastrointestinal: Negative for vomiting.  Endocrine:       Negative aside from HPI  Genitourinary:       Neg aside from HPI   Musculoskeletal:       Per HPI, otherwise negative  Skin: Negative.   Neurological: Negative for syncope.     Physical Exam Updated Vital Signs BP 128/66   Pulse 74   Temp (!) 96.1 F (35.6 C) (Rectal)   Resp 16   Ht 5\' 9"  (1.753 m)   Wt 160 lb (72.6 kg)   SpO2 98%   BMI 23.63 kg/m   Physical Exam  Constitutional: He is oriented to person, place, and time.  Uncomfortable appearing male  HENT:  Head: Normocephalic and atraumatic.  Eyes: Conjunctivae and EOM are normal.  Cardiovascular: Normal rate and regular rhythm.   Pulmonary/Chest: Effort normal. No stridor. No respiratory distress.  Abdominal: He exhibits no distension.  Musculoskeletal: He exhibits no edema.  Neurological: He is alert  and oriented to person, place, and time.  Skin: Skin is warm and dry.  Psychiatric: He has a normal mood and affect.  Nursing note and vitals reviewed.  Initial vitals notable for hypothermia, temperature 90.1 degrees. Patient received rewarming with bear hugger.    ED Treatments / Results   Procedures Procedures (including critical care time)   Initial Impression / Assessment and Plan / ED Course  I have reviewed the triage vital signs and the nursing notes.  Pertinent labs & imaging results that were available during my care of the patient were reviewed by me and considered in my medical decision making (see chart for  details).   Update: Patient substantially better, temperature improving.   Update: Patient now more awake, requests coffee.  Temperature 96 degrees  12:00 PM Patient now complained by his wife. She agrees the patient now appears better. The patient himself states that he feels substantially better. We discussed the importance of post monitoring, pulling up with a primary care physician. Patient now describes concern for rash. On exam patient has diffuse cutaneous lesions consistent with tinea. We discussed appropriate therapeutic options, the patient will receive appropriate prescription, have it re-checked with primary care in about one week.   Final Clinical Impressions(s) / ED Diagnoses   Final diagnoses:  Hypothermia, initial encounter  Rash    New Prescriptions New Prescriptions   SELENIUM SULFIDE (SELSUN) 2.5 % SHAMPOO    Apply 1 application topically daily. Please apply this medication to areas of rash.  Let the medication sit for about ten minutes, then shower and completely clean the areas.     Carmin Muskrat, MD 03/23/17 1204

## 2018-02-05 ENCOUNTER — Telehealth: Payer: Self-pay | Admitting: *Deleted

## 2018-02-05 NOTE — Telephone Encounter (Signed)
Patient's girlfriend called to cancel appointment today due to transportation issues, asked to reschedule.  RN returned the call, left message asking them to call back and schedule. Landis Gandy, RN

## 2018-02-12 ENCOUNTER — Encounter: Payer: Self-pay | Admitting: *Deleted

## 2018-02-21 ENCOUNTER — Ambulatory Visit: Payer: Self-pay

## 2018-07-07 ENCOUNTER — Ambulatory Visit (INDEPENDENT_AMBULATORY_CARE_PROVIDER_SITE_OTHER): Payer: Self-pay | Admitting: Pharmacist

## 2018-07-07 DIAGNOSIS — Z7251 High risk heterosexual behavior: Secondary | ICD-10-CM

## 2018-07-07 NOTE — Progress Notes (Signed)
Date:  07/07/2018   HPI: Timothy Buckley is a 52 y.o. male who presents to the [facility] clinic today to discuss PrEP.  Insured   []    Uninsured  [x]    Patient Active Problem List   Diagnosis Date Noted  . Right inguinal hernia 07/09/2013  . Umbilical hernia 08/65/7846  . Allergic rhinitis 03/27/2013  . 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   NAPROXEN (NAPROSYN) 375 MG TABLET    Take 1 tablet (375 mg total) by mouth 2 (two) times daily.  Modified Medications   No medications on file  Discontinued Medications   No medications on file    Allergies: No Known Allergies  Past Medical History: No past medical history on file.  Social History: Social History   Socioeconomic History  . Marital status: Single    Spouse name: Not on file  . Number of children: Not on file  . Years of education: Not on file  . Highest education level: Not on file  Occupational History  . Not on file  Social Needs  . Financial resource strain: Not on file  . Food insecurity:    Worry: Not on file    Inability: Not on file  . Transportation needs:    Medical: Not on file    Non-medical: Not on file  Tobacco Use  . Smoking status: Current Every Day Smoker    Last attempt to quit: 01/08/2013    Years since quitting: 5.4  . Smokeless tobacco: Never Used  Substance and Sexual Activity  . Alcohol use: Yes  . Drug use: No  . Sexual activity: Not on file  Lifestyle  . Physical activity:    Days per week: Not on file    Minutes per session: Not on file  . Stress: Not on file  Relationships  . Social connections:    Talks on phone: Not on file    Gets together: Not on file    Attends religious service: Not on file    Active member of club or organization: Not on file    Attends meetings of clubs  or organizations: Not on file    Relationship status: Not on file  Other Topics Concern  . Not on file  Social History Narrative  . Not on file    [facility] HIV PREP FLOWSHEET RESULTS 07/07/2018  Insurance Status Uninsured  How did you hear? 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 No  HIV symptoms? N/A  PrEP Eligibility CrCl >60 ml/min;Substantial risk for HIV  Paper work received? Yes    Labs:  SCr: No results found for: CREATININE HIV No results found for: HIV Hepatitis B No results found for: HEPBSAB, HEPBSAG, HEPBCAB Hepatitis C No results found for: HEPCAB, HCVRNAPCRQN Hepatitis A No results found for: HAV RPR and STI No results found for: LABRPR, RPRTITER  No flowsheet data found.  Assessment: Timothy Buckley is here today with his girlfriend who is HIV positive and seeing Dr. Johnnye Sima. He is interested in PrEP. I spent time  discussing our process for PrEP here and what PrEP is including one pill once daily to prevent HIV. I also explained that it was important for his girlfriend to take her HIV medications and be undetectable which essentially means she would be unable to transmit the virus to him.  He is uninsured, so I will begin the process and fill out the application to start getting him access to Truvada.  Will get all baseline labs today and coordinate with [facility] when he is approved.   Plan: - HIV antibody, urine gc/c screening, RPR, BMET, Hepatitis B antibody/antigen - 30 days of Truvada if HIV negative - Will set up f/u appointment when I call him with results  Timothy Buckley, PharmD, [place], [place] for Infectious Disease 07/07/2018, 4:14 PM

## 2018-07-08 ENCOUNTER — Telehealth: Payer: Self-pay | Admitting: Pharmacist

## 2018-07-08 LAB — URINE CYTOLOGY ANCILLARY ONLY
Chlamydia: NEGATIVE
NEISSERIA GONORRHEA: NEGATIVE

## 2018-07-08 LAB — BASIC METABOLIC PANEL
BUN: 17 mg/dL (ref 7–25)
CALCIUM: 9.5 mg/dL (ref 8.6–10.3)
CO2: 27 mmol/L (ref 20–32)
Chloride: 106 mmol/L (ref 98–110)
Creat: 1.1 mg/dL (ref 0.70–1.33)
GLUCOSE: 81 mg/dL (ref 65–99)
Potassium: 4.6 mmol/L (ref 3.5–5.3)
SODIUM: 138 mmol/L (ref 135–146)

## 2018-07-08 LAB — HEPATITIS B SURFACE ANTIGEN: Hepatitis B Surface Ag: NONREACTIVE

## 2018-07-08 LAB — HIV ANTIBODY (ROUTINE TESTING W REFLEX): HIV 1&2 Ab, 4th Generation: NONREACTIVE

## 2018-07-08 LAB — RPR: RPR Ser Ql: NONREACTIVE

## 2018-07-08 LAB — HEPATITIS B SURFACE ANTIBODY,QUALITATIVE: Hep B S Ab: NONREACTIVE

## 2018-07-08 NOTE — Telephone Encounter (Signed)
Called patient to let him know that his HIV antibody was negative.  Will send in application to [facility] for approval for Truvada.  Will call him when I hear back from them.

## 2018-07-09 ENCOUNTER — Telehealth: Payer: Self-pay | Admitting: Pharmacist

## 2018-07-09 DIAGNOSIS — Z7251 High risk heterosexual behavior: Secondary | ICD-10-CM

## 2018-07-09 MED ORDER — EMTRICITABINE-TENOFOVIR DF 200-300 MG PO TABS: 1.0000 | ORAL_TABLET | Freq: Every day | ORAL | 0 refills | Status: DC

## 2018-07-09 MED FILL — TRUVADA 200-300 MG TABS: 200-300 | 30 days supply | Qty: 30 | Fill #0

## 2018-07-09 NOTE — Telephone Encounter (Signed)
Patient is approved to receive Truvada through [place] for uninsured PrEP. Will mail to his house from [facility].

## 2018-08-13 ENCOUNTER — Ambulatory Visit (INDEPENDENT_AMBULATORY_CARE_PROVIDER_SITE_OTHER): Payer: Self-pay | Admitting: Pharmacist

## 2018-08-13 DIAGNOSIS — Z7251 High risk heterosexual behavior: Secondary | ICD-10-CM

## 2018-08-13 NOTE — Patient Instructions (Signed)
[facility] [phone] 

## 2018-08-13 NOTE — Progress Notes (Signed)
Date:  08/13/2018   HPI: ELY BALLEN is a 52 y.o. male who presents to the [facility] clinic for his 1 month PrEP follow-up.  Insured   []    Uninsured  [x]    Patient Active Problem List   Diagnosis Date Noted  . Right inguinal hernia 07/09/2013  . Umbilical hernia 24/23/5361  . Allergic rhinitis 03/27/2013  . 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   GUAIFENESIN-CODEINE 100-10 MG/5ML SYRUP    Take 5-10 mLs by mouth every 6 (six) hours as needed for cough.   NAPROXEN (NAPROSYN) 375 MG TABLET    Take 1 tablet (375 mg total) by mouth 2 (two) times daily.  Modified Medications   No medications on file  Discontinued Medications   No medications on file    Allergies: No Known Allergies  Past Medical History: No past medical history on file.  Social History: Social History   Socioeconomic History  . Marital status: Single    Spouse name: Not on file  . Number of children: Not on file  . Years of education: Not on file  . Highest education level: Not on file  Occupational History  . Not on file  Social Needs  . Financial resource strain: Not on file  . Food insecurity:    Worry: Not on file    Inability: Not on file  . Transportation needs:    Medical: Not on file    Non-medical: Not on file  Tobacco Use  . Smoking status: Current Every Day Smoker    Last attempt to quit: 01/08/2013    Years since quitting: 5.5  . Smokeless tobacco: Never Used  Substance and Sexual Activity  . Alcohol use: Yes  . Drug use: No  . Sexual activity: Not on file  Lifestyle  . Physical activity:    Days per week: Not on file    Minutes per session: Not on file  . Stress: Not on file  Relationships  . Social connections:    Talks on phone: Not on file    Gets together: Not on file    Attends religious  service: Not on file    Active member of club or organization: Not on file    Attends meetings of clubs or organizations: Not on file    Relationship status: Not on file  Other Topics Concern  . Not on file  Social History Narrative  . Not on file    [facility] HIV PREP FLOWSHEET RESULTS 08/13/2018 07/07/2018  Insurance Status Uninsured Uninsured  How did you hear? 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 No No  HIV symptoms? N/A N/A  PrEP Eligibility HIV negative;Substantial risk for HIV CrCl >60 ml/min;Substantial risk for HIV  Paper work received? Yes Yes    Labs:  SCr: Lab Results  Component Value Date   CREATININE 1.10 07/07/2018   HIV Lab Results  Component Value Date   HIV NON-REACTIVE 07/07/2018   Hepatitis B Lab Results  Component Value Date  HEPBSAB NON-REACTIVE 07/07/2018   HEPBSAG NON-REACTIVE 07/07/2018   Hepatitis C No results found for: HEPCAB, HCVRNAPCRQN Hepatitis A No results found for: HAV RPR and STI Lab Results  Component Value Date   LABRPR NON-REACTIVE 07/07/2018    STI Results GC CT  07/07/2018 Negative Negative    Assessment: Jaimin is here today with his girlfriend who is HIV positive.  He started PrEP ~1 month ago.  He reports missing no doses and no side effects or issues with the medication. No problem getting it from the pharmacy.  He would like it mailed to his home. He has ~4 pills left in his current bottle. He is only with his girlfriend and they do use condoms, of which they asked for some today.  Will check a HIV antibody and BMET and have him f/u in 3 months.  He is complaining of a hernia today and states he cannot have it fixed or looked at because he has no  insurance.  I gave him [facility] number and told him to call and inquire about their services.  Plan: - Continue Truvada PO once daily - HIV antibody and BMET today - F/u with me 11/26 at 145pm  Carlisle Enke L. Shawnte Demarest, PharmD, [place], [place] for Infectious Disease 08/13/2018, 4:52 PM

## 2018-08-13 NOTE — Addendum Note (Signed)
Addended by: Darletta Moll on: 08/13/2018 05:02 PM   Modules accepted: Level of Service

## 2018-08-14 ENCOUNTER — Other Ambulatory Visit: Payer: Self-pay | Admitting: Pharmacist

## 2018-08-14 DIAGNOSIS — Z7251 High risk heterosexual behavior: Secondary | ICD-10-CM

## 2018-08-14 LAB — BASIC METABOLIC PANEL
BUN: 12 mg/dL (ref 7–25)
CO2: 27 mmol/L (ref 20–32)
Calcium: 9.2 mg/dL (ref 8.6–10.3)
Chloride: 109 mmol/L (ref 98–110)
Creat: 1.27 mg/dL (ref 0.70–1.33)
Glucose, Bld: 91 mg/dL (ref 65–99)
Potassium: 4.6 mmol/L (ref 3.5–5.3)
Sodium: 141 mmol/L (ref 135–146)

## 2018-08-14 LAB — HIV ANTIBODY (ROUTINE TESTING W REFLEX): HIV: NONREACTIVE

## 2018-08-14 MED ORDER — EMTRICITABINE-TENOFOVIR DF 200-300 MG PO TABS: 1.0000 | ORAL_TABLET | Freq: Every day | ORAL | 2 refills | Status: AC

## 2018-08-14 MED FILL — TRUVADA 200-300 MG TABS: 200-300 | 30 days supply | Qty: 30 | Fill #0

## 2018-08-14 NOTE — Progress Notes (Unsigned)
HIV negative. Will send in 3 more months of Truvada.

## 2018-09-03 ENCOUNTER — Ambulatory Visit: Payer: Self-pay | Admitting: Family Medicine

## 2018-09-11 MED FILL — TRUVADA 200-300 MG TABS: 200-300 | 30 days supply | Qty: 30 | Fill #1

## 2018-10-08 MED FILL — TRUVADA 200-300 MG TABS: 200-300 | 30 days supply | Qty: 30 | Fill #2

## 2018-11-11 ENCOUNTER — Ambulatory Visit: Payer: Self-pay

## 2018-11-12 ENCOUNTER — Ambulatory Visit: Payer: Self-pay

## 2018-11-21 ENCOUNTER — Ambulatory Visit: Payer: Self-pay

## 2018-12-29 ENCOUNTER — Ambulatory Visit: Payer: Self-pay

## 2019-02-27 ENCOUNTER — Ambulatory Visit: Payer: Self-pay | Admitting: Pharmacist

## 2019-02-27 ENCOUNTER — Telehealth: Payer: Self-pay | Admitting: Pharmacy Technician

## 2019-02-27 NOTE — Telephone Encounter (Signed)
[facility] Patient Advocate Encounter    Findings of the benefits investigation conducted this morning via test claims for the patient's upcoming appointment on 02/27/2019 are as follows:   Insurance: uninsured (covered by [employer]) Test run with drug historically used, will run another test claim if new drug prescribed Estimated copay amount: $0 until 07/09/2019  [facility] Patient Advocate will follow up once patient arrives for their appointment.  Bartholomew Crews, CPhT [facility] Patient [facility] for Infectious Disease Phone: [phone] Fax: [phone] 02/27/2019 10:29 AM

## 2019-09-28 ENCOUNTER — Other Ambulatory Visit: Payer: Self-pay

## 2019-09-28 ENCOUNTER — Emergency Department (HOSPITAL_COMMUNITY)
Admission: EM | Admit: 2019-09-28 | Discharge: 2019-09-29 | Disposition: A | Discharge status: Home / Self Care | Payer: Self-pay | Attending: Emergency Medicine | Admitting: Emergency Medicine

## 2019-09-28 ENCOUNTER — Encounter (HOSPITAL_COMMUNITY): Payer: Self-pay | Admitting: Pharmacy Technician

## 2019-09-28 DIAGNOSIS — L299 Pruritus, unspecified: Secondary | ICD-10-CM | POA: Insufficient documentation

## 2019-09-28 DIAGNOSIS — T782XXA Anaphylactic shock, unspecified, initial encounter: Secondary | ICD-10-CM

## 2019-09-28 DIAGNOSIS — R110 Nausea: Secondary | ICD-10-CM | POA: Insufficient documentation

## 2019-09-28 DIAGNOSIS — F17210 Nicotine dependence, cigarettes, uncomplicated: Secondary | ICD-10-CM | POA: Insufficient documentation

## 2019-09-28 LAB — CBC WITH DIFFERENTIAL/PLATELET
Abs Immature Granulocytes: 0.16 10*3/uL — ABNORMAL HIGH (ref 0.00–0.07)
Basophils Absolute: 0.1 10*3/uL (ref 0.0–0.1)
Basophils Relative: 1 %
Eosinophils Absolute: 0.7 10*3/uL — ABNORMAL HIGH (ref 0.0–0.5)
Eosinophils Relative: 8 %
HCT: 46.6 % (ref 39.0–52.0)
Hemoglobin: 14.7 g/dL (ref 13.0–17.0)
Immature Granulocytes: 2 %
Lymphocytes Relative: 35 %
Lymphs Abs: 3 10*3/uL (ref 0.7–4.0)
MCH: 31.4 pg (ref 26.0–34.0)
MCHC: 31.5 g/dL (ref 30.0–36.0)
MCV: 99.6 fL (ref 80.0–100.0)
Monocytes Absolute: 0.6 10*3/uL (ref 0.1–1.0)
Monocytes Relative: 7 %
Neutro Abs: 4.1 10*3/uL (ref 1.7–7.7)
Neutrophils Relative %: 47 %
Platelets: 220 10*3/uL (ref 150–400)
RBC: 4.68 MIL/uL (ref 4.22–5.81)
RDW: 13.5 % (ref 11.5–15.5)
WBC: 8.7 10*3/uL (ref 4.0–10.5)
nRBC: 0 % (ref 0.0–0.2)

## 2019-09-28 LAB — BASIC METABOLIC PANEL
Anion gap: 16 — ABNORMAL HIGH (ref 5–15)
BUN: 10 mg/dL (ref 6–20)
CO2: 18 mmol/L — ABNORMAL LOW (ref 22–32)
Calcium: 8.2 mg/dL — ABNORMAL LOW (ref 8.9–10.3)
Chloride: 102 mmol/L (ref 98–111)
Creatinine, Ser: 1.37 mg/dL — ABNORMAL HIGH (ref 0.61–1.24)
GFR calc Af Amer: >60 mL/min (ref >60)
GFR calc non Af Amer: 58 mL/min — ABNORMAL LOW (ref >60)
Glucose, Bld: 103 mg/dL — ABNORMAL HIGH (ref 70–99)
Potassium: 3.7 mmol/L (ref 3.5–5.1)
Sodium: 136 mmol/L (ref 135–145)

## 2019-09-28 MED ORDER — METHYLPREDNISOLONE SODIUM SUCC 125 MG IJ SOLR
80.0000 mg | Freq: Once | INTRAMUSCULAR | Status: AC
  Administered 2019-09-28: 80 mg via INTRAVENOUS
  Filled 2019-09-28: qty 2

## 2019-09-28 MED ORDER — EPINEPHRINE 0.3 MG/0.3ML IJ SOAJ: 0.3000 mg | INTRAMUSCULAR | 0 refills | Status: AC | PRN

## 2019-09-28 MED ORDER — FAMOTIDINE IN NACL 20-0.9 MG/50ML-% IV SOLN
20.0000 mg | Freq: Once | INTRAVENOUS | Status: AC
  Administered 2019-09-28: 20 mg via INTRAVENOUS
  Filled 2019-09-28: qty 50

## 2019-09-28 MED ORDER — SODIUM CHLORIDE 0.9 % IV BOLUS
1000.0000 mL | Freq: Once | INTRAVENOUS | Status: AC
  Administered 2019-09-28: 1000 mL via INTRAVENOUS

## 2019-09-28 NOTE — ED Provider Notes (Signed)
[facility] EMERGENCY DEPARTMENT Provider Note   CSN: [id number] Arrival date & time: 09/28/19  2211     History   Chief Complaint No chief complaint on file.   HPI Timothy Buckley is a 53 y.o. male.     HPI   53 year old male with symptoms consistent with anaphylactic reaction.  Patient took Ursa powder earlier today for headache.  He has never had it before.  Shortly later he began having diffuse itching.  Swelling in the right side of his face.  Generally not feeling well. Nausea. No v/d. No abdominal pain/cramping. On EMS arrival they report that he appeared pale and was diaphoretic.  His initial blood pressure was 80/40.  They report decreased breath sounds at the bases and diminished apically.  Establish an IV.  They given 50 mg of Benadryl.  Received a 0.6 mg of IM 12-998 epinephrine.  Last blood pressure [id number] systolic.  Clinically appearing better.  Patient states that his symptoms feel like they are improving as well.  No new exposures that he is aware of.  Has never had symptoms like this previously.  No past medical history on file.  Patient Active Problem List   Diagnosis Date Noted  . Right inguinal hernia 07/09/2013  . Umbilical hernia [id number]  . Allergic rhinitis 03/27/2013  . 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    Home Medications    Prior to Admission medications   Medication Sig Start Date End Date Taking? Authorizing Provider  emtricitabine-tenofovir (TRUVADA) 200-300 MG tablet Take 1 tablet by mouth daily. 08/14/18   Kuppelweiser, Cassie L, RPH-CPP  guaiFENesin-codeine 100-10 MG/5ML syrup Take 5-10 mLs by mouth every 6 (six) hours as needed for cough. Patient not taking: Reported on 03/23/2017 08/15/15   Margarita Mail, PA-C  naproxen (NAPROSYN) 375 MG tablet Take 1 tablet (375 mg total) by mouth 2 (two) times daily. Patient not taking: Reported on 03/23/2017 08/15/15   Margarita Mail, PA-C    Family  History Family History  Problem Relation Age of Onset  . Diabetes Mother   . Heart disease Mother   . Hypertension Mother     Social History Social History   Tobacco Use  . Smoking status: Current Every Day Smoker    Last attempt to quit: 01/08/2013    Years since quitting: 6.7  . Smokeless tobacco: Never Used  Substance Use Topics  . Alcohol use: Yes  . Drug use: No     Allergies   Patient has no known allergies.   Review of Systems Review of Systems  All systems reviewed and negative, other than as noted in HPI.  Physical Exam Updated Vital Signs BP 97/70   Pulse 64   Temp (!) 97 F (36.1 C) (Oral)   Resp 13   SpO2 100%   Physical Exam Vitals signs and nursing note reviewed.  Constitutional:      General: He is not in acute distress.    Appearance: He is well-developed.  HENT:     Head: Normocephalic.     Comments: Swelling to the right upper lip/right cheek.  Oropharynx is clear.  Handling secretions.  Neck is supple.  No stridor. Eyes:     General:        Right eye: No discharge.        Left eye: No discharge.     Conjunctiva/sclera: Conjunctivae normal.  Neck:     Musculoskeletal: Neck supple.  Cardiovascular:  Rate and Rhythm: Normal rate and regular rhythm.     Heart sounds: Normal heart sounds. No murmur. No friction rub. No gallop.   Pulmonary:     Effort: Pulmonary effort is normal. No respiratory distress.     Breath sounds: Normal breath sounds.  Abdominal:     General: There is no distension.     Palpations: Abdomen is soft.     Tenderness: There is no abdominal tenderness.  Musculoskeletal:        General: No tenderness.  Skin:    General: Skin is warm and dry.  Neurological:     Mental Status: He is alert.  Psychiatric:        Behavior: Behavior normal.        Thought Content: Thought content normal.      ED Treatments / Results  Labs (all labs ordered are listed, but only abnormal results are displayed) Labs Reviewed   CBC WITH DIFFERENTIAL/PLATELET - Abnormal; Notable for the following components:      Result Value   Eosinophils Absolute 0.7 (*)    Abs Immature Granulocytes 0.16 (*)    All other components within normal limits  BASIC METABOLIC PANEL - Abnormal; Notable for the following components:   CO2 18 (*)    Glucose, Bld 103 (*)    Creatinine, Ser 1.37 (*)    Calcium 8.2 (*)    GFR calc non Af Amer 58 (*)    Anion gap 16 (*)    All other components within normal limits    EKG EKG Interpretation  Date/Time:  Monday September 28 2019 22:18:49 EDT Ventricular Rate:  64 PR Interval:    QRS Duration: 105 QT Interval:  414 QTC Calculation: 428 R Axis:   -70 Text Interpretation:  Sinus rhythm Probable left atrial enlargement Abnormal R-wave progression, early transition Inferior infarct, old Confirmed by Virgel Manifold [phone]) on 09/28/2019 10:41:40 PM   Radiology No results found.  Procedures Procedures (including critical care time)  CRITICAL CARE Performed by: Virgel Manifold Total critical care time: 35 minutes Critical care time was exclusive of separately billable procedures and treating other patients. Critical care was necessary to treat or prevent imminent or life-threatening deterioration. Critical care was time spent personally by me on the following activities: development of treatment plan with patient and/or surrogate as well as nursing, discussions with consultants, evaluation of patient's response to treatment, examination of patient, obtaining history from patient or surrogate, ordering and performing treatments and interventions, ordering and review of laboratory studies, ordering and review of radiographic studies, pulse oximetry and re-evaluation of patient's condition.   Medications Ordered in ED Medications  sodium chloride 0.9 % bolus 1,000 mL (has no administration in time range)  famotidine (PEPCID) IVPB 20 mg premix (has no administration in time range)   methylPREDNISolone sodium succinate (SOLU-MEDROL) 125 mg/2 mL injection 80 mg (has no administration in time range)     Initial Impression / Assessment and Plan / ED Course  I have reviewed the triage vital signs and the nursing notes.  Pertinent labs & imaging results that were available during my care of the patient were reviewed by me and considered in my medical decision making (see chart for details).    53 year old male with symptoms concerning for an anaphylactic reaction.  Subjectively patient states that he is feeling better.  Some right-sided facial swelling but his airway seems widely patent.  His O2 sats are normal.  He does sound somewhat diminished to me but  is in no respiratory distress.  No wheezing or other adventitious breath sounds appreciated.  Clinically appears to be improving per EMS reports.  He was placed on the monitor.  We will going to bolus him a liter of IV fluids.  Pepcid.  Steroids.  Will continue to observe.  Patient advised that he does not ever take Goody powder again and to avoid aspirin.   Final Clinical Impressions(s) / ED Diagnoses   Final diagnoses:  Anaphylaxis, initial encounter    ED Discharge Orders    None       Virgel Manifold, MD 10/03/19 1154

## 2019-09-28 NOTE — ED Notes (Signed)
Marlane Hatcher, [place], [phone] would like an update

## 2019-09-28 NOTE — Discharge Instructions (Signed)
Don't ever take [employer] again. List aspirin as an allergy.

## 2019-09-28 NOTE — ED Triage Notes (Signed)
Pt bib ems from home with allergic reaction. Pt with edema to face with diminished lung sounds. Pt pale and diaphoretic for ems. Given benadryl and 0.6 epi en route. Initial BP 80 systolic improved to [id number] on arrival. Started after taking BC powder for the first time.

## 2019-09-29 NOTE — ED Provider Notes (Signed)
Care assumed from Dr. Wilson Singer, patient had allergic reaction following use of Goody powder, likely allergy to aspirin.  He has been observed in the ED and has been stable.  Blood pressure is normal and he is having no difficulty with breathing, speech, swallowing.  He is felt to be safe for discharge.   Delora Fuel, MD [id number] [phone]

## 2019-09-29 NOTE — ED Notes (Signed)
Discharge instructions reviewed, pt verbalized understanding.

## 2022-04-07 ENCOUNTER — Emergency Department (HOSPITAL_COMMUNITY): Payer: Self-pay

## 2022-04-07 ENCOUNTER — Other Ambulatory Visit: Payer: Self-pay

## 2022-04-07 ENCOUNTER — Encounter (HOSPITAL_COMMUNITY): Payer: Self-pay | Admitting: Emergency Medicine

## 2022-04-07 ENCOUNTER — Emergency Department (HOSPITAL_COMMUNITY)
Admission: EM | Admit: 2022-04-07 | Discharge: 2022-04-07 | Disposition: A | Discharge status: Home / Self Care | Payer: Self-pay | Attending: Emergency Medicine | Admitting: Emergency Medicine

## 2022-04-07 DIAGNOSIS — R062 Wheezing: Secondary | ICD-10-CM | POA: Insufficient documentation

## 2022-04-07 DIAGNOSIS — R0602 Shortness of breath: Secondary | ICD-10-CM | POA: Insufficient documentation

## 2022-04-07 DIAGNOSIS — Z20822 Contact with and (suspected) exposure to covid-19: Secondary | ICD-10-CM | POA: Insufficient documentation

## 2022-04-07 DIAGNOSIS — F17210 Nicotine dependence, cigarettes, uncomplicated: Secondary | ICD-10-CM | POA: Insufficient documentation

## 2022-04-07 LAB — BASIC METABOLIC PANEL
Anion gap: 6 (ref 5–15)
BUN: 11 mg/dL (ref 6–20)
CO2: 26 mmol/L (ref 22–32)
Calcium: 8.9 mg/dL (ref 8.9–10.3)
Chloride: 107 mmol/L (ref 98–111)
Creatinine, Ser: 1.01 mg/dL (ref 0.61–1.24)
GFR, Estimated: >60 mL/min (ref >60)
Glucose, Bld: 104 mg/dL — ABNORMAL HIGH (ref 70–99)
Potassium: 3.9 mmol/L (ref 3.5–5.1)
Sodium: 139 mmol/L (ref 135–145)

## 2022-04-07 LAB — CBC WITH DIFFERENTIAL/PLATELET
Abs Immature Granulocytes: 0.03 10*3/uL (ref 0.00–0.07)
Basophils Absolute: 0 10*3/uL (ref 0.0–0.1)
Basophils Relative: 1 %
Eosinophils Absolute: 0.5 10*3/uL (ref 0.0–0.5)
Eosinophils Relative: 8 %
HCT: 45.6 % (ref 39.0–52.0)
Hemoglobin: 14.9 g/dL (ref 13.0–17.0)
Immature Granulocytes: 1 %
Lymphocytes Relative: 17 %
Lymphs Abs: 1.1 10*3/uL (ref 0.7–4.0)
MCH: 31.2 pg (ref 26.0–34.0)
MCHC: 32.7 g/dL (ref 30.0–36.0)
MCV: 95.4 fL (ref 80.0–100.0)
Monocytes Absolute: 0.4 10*3/uL (ref 0.1–1.0)
Monocytes Relative: 7 %
Neutro Abs: 4.3 10*3/uL (ref 1.7–7.7)
Neutrophils Relative %: 66 %
Platelets: 213 10*3/uL (ref 150–400)
RBC: 4.78 MIL/uL (ref 4.22–5.81)
RDW: 14.7 % (ref 11.5–15.5)
WBC: 6.5 10*3/uL (ref 4.0–10.5)
nRBC: 0 % (ref 0.0–0.2)

## 2022-04-07 LAB — RESP PANEL BY RT-PCR (FLU A&B, COVID) ARPGX2
Influenza A by PCR: NEGATIVE
Influenza B by PCR: NEGATIVE
SARS Coronavirus 2 by RT PCR: NEGATIVE

## 2022-04-07 MED ORDER — ALBUTEROL SULFATE (2.5 MG/3ML) 0.083% IN NEBU
5.0000 mg | INHALATION_SOLUTION | Freq: Once | RESPIRATORY_TRACT | Status: AC
  Administered 2022-04-07: 5 mg via RESPIRATORY_TRACT
  Filled 2022-04-07: qty 6

## 2022-04-07 MED ORDER — IPRATROPIUM BROMIDE 0.02 % IN SOLN
0.5000 mg | Freq: Once | RESPIRATORY_TRACT | Status: AC
  Administered 2022-04-07: 0.5 mg via RESPIRATORY_TRACT
  Filled 2022-04-07: qty 2.5

## 2022-04-07 MED ORDER — PREDNISONE 10 MG PO TABS: 20.0000 mg | ORAL_TABLET | Freq: Every day | ORAL | 0 refills | Status: AC

## 2022-04-07 MED ORDER — ALBUTEROL SULFATE HFA 108 (90 BASE) MCG/ACT IN AERS
1.0000 | INHALATION_SPRAY | Freq: Once | RESPIRATORY_TRACT | Status: AC
  Administered 2022-04-07: 1 via RESPIRATORY_TRACT
  Filled 2022-04-07: qty 6.7

## 2022-04-07 NOTE — ED Notes (Signed)
Received verbal report from Kuch Y RN at this time 

## 2022-04-07 NOTE — ED Provider Notes (Signed)
?[facility] ?Provider Note ? ? ?CSN: [id number] ?Arrival date & time: 04/07/22  1630 ? ?  ? ?History ? ?Chief Complaint  ?Patient presents with  ? Shortness of Breath  ?  Dry Cough  ? ? ?Timothy Buckley is a 56 y.o. male who presents to the ED today via EMS with complaint of gradual onset, constant, dry cough for the past 3 days. Pt also complains of SOB. Per EMS pt was found to be wheezing with O2 sat at 90% on RA - was provided 125 mg solumedrol and a duoneb treatment with some relief. Pt with hx of asthma as well as a current everyday smoker - smokes about 1-2 ppd. Pt denies having inhalers at home and states he has never had them. Denies any recent sick contacts. No fevers, chills, chest pain, leg swelling, or any other associated symptoms.  ? ?The history is provided by the patient and medical records.  ? ?  ? ?Home Medications ?Prior to Admission medications   ?Medication Sig Start Date End Date Taking? Authorizing Provider  ?predniSONE (DELTASONE) 10 MG tablet Take 2 tablets (20 mg total) by mouth daily for 5 days. 04/07/22 04/12/22 Yes Meer Reindl, PA-C  ?emtricitabine-tenofovir (TRUVADA) 200-300 MG tablet Take 1 tablet by mouth daily. ?Patient not taking: Reported on 09/28/2019 08/14/18   Kuppelweiser, Rubin Payor L, RPH-CPP  ?EPINEPHrine (EPIPEN 2-PAK) 0.3 mg/0.3 mL IJ SOAJ injection Inject 0.3 mLs (0.3 mg total) into the muscle as needed for anaphylaxis. 09/28/19   Virgel Manifold, MD  ?guaiFENesin-codeine 100-10 MG/5ML syrup Take 5-10 mLs by mouth every 6 (six) hours as needed for cough. ?Patient not taking: Reported on 09/28/2019 08/15/15   Margarita Mail, PA-C  ?naproxen (NAPROSYN) 375 MG tablet Take 1 tablet (375 mg total) by mouth 2 (two) times daily. ?Patient not taking: Reported on 09/28/2019 08/15/15   Margarita Mail, PA-C  ?   ? ?Allergies    ?Bc powder [aspirin-salicylamide-caffeine]   ? ?Review of Systems   ?Review of Systems  ?Constitutional:  Negative for  chills, diaphoresis and fever.  ?HENT:  Negative for congestion, rhinorrhea and sore throat.   ?Respiratory:  Positive for cough, shortness of breath and wheezing.   ?Cardiovascular:  Negative for chest pain, palpitations and leg swelling.  ?Gastrointestinal:  Negative for nausea and vomiting.  ?Musculoskeletal:  Negative for myalgias.  ?Neurological:  Negative for headaches.  ?All other systems reviewed and are negative. ? ?Physical Exam ?Updated Vital Signs ?BP (!) 132/103 (BP Location: Right Arm)   Pulse 78   Temp 98.4 ?F (36.9 ?C) (Oral)   Resp 19   Ht '5\' 9"'$  (1.753 m)   Wt 68 kg   SpO2 100%   BMI 22.15 kg/m?  ?Physical Exam ?Vitals and nursing note reviewed.  ?Constitutional:   ?   Appearance: He is not ill-appearing or diaphoretic.  ?HENT:  ?   Head: Normocephalic and atraumatic.  ?Eyes:  ?   Conjunctiva/sclera: Conjunctivae normal.  ?Cardiovascular:  ?   Rate and Rhythm: Normal rate and regular rhythm.  ?   Pulses: Normal pulses.  ?Pulmonary:  ?   Effort: Pulmonary effort is normal.  ?   Breath sounds: Wheezing present. No decreased breath sounds, rhonchi or rales.  ?   Comments: Speaking in full sentences without difficulty. Satting 98% on RA. Lungs with end expiratory wheezing in upper lung fields.  ?Abdominal:  ?   Palpations: Abdomen is soft.  ?   Tenderness: There is no  abdominal tenderness.  ?Musculoskeletal:  ?   Cervical back: Neck supple.  ?   Right lower leg: No edema.  ?   Left lower leg: No edema.  ?Skin: ?   General: Skin is warm and dry.  ?Neurological:  ?   Mental Status: He is alert.  ? ? ?ED Results / Procedures / Treatments   ?Labs ?(all labs ordered are listed, but only abnormal results are displayed) ?Labs Reviewed  ?BASIC METABOLIC PANEL - Abnormal; Notable for the following components:  ?    Result Value  ? Glucose, Bld 104 (*)   ? All other components within normal limits  ?RESP PANEL BY RT-PCR (FLU A&B, COVID) ARPGX2  ?CBC WITH DIFFERENTIAL/PLATELET  ? ? ?EKG ?EKG  Interpretation ? ?Date/Time:  Saturday April 07 2022 16:38:19 EDT ?Ventricular Rate:  71 ?PR Interval:  155 ?QRS Duration: 88 ?QT Interval:  384 ?QTC Calculation: 418 ?R Axis:   268 ?Text Interpretation: Sinus rhythm Left anterior fascicular block Nonspecific T wave abnormality Confirmed by Lajean Saver [phone]) on 04/07/2022 4:53:07 PM ? ?Radiology ?DG Chest Port 1 View ? ?Result Date: 04/07/2022 ?CLINICAL DATA:  Cough, wheezing EXAM: PORTABLE CHEST 1 VIEW COMPARISON:  01/18/2022 FINDINGS: The heart size and mediastinal contours are within normal limits. Both lungs are clear. The visualized skeletal structures are unremarkable. IMPRESSION: Normal study. Electronically Signed   By: Rolm Baptise M.D.   On: 04/07/2022 19:48   ? ?Procedures ?Procedures  ? ? ?Medications Ordered in ED ?Medications  ?albuterol (VENTOLIN HFA) 108 (90 Base) MCG/ACT inhaler 1 puff (has no administration in time range)  ?albuterol (PROVENTIL) (2.5 MG/3ML) 0.083% nebulizer solution 5 mg (5 mg Nebulization Given 04/07/22 1728)  ?ipratropium (ATROVENT) nebulizer solution 0.5 mg (0.5 mg Nebulization Given 04/07/22 1728)  ?albuterol (PROVENTIL) (2.5 MG/3ML) 0.083% nebulizer solution 5 mg (5 mg Nebulization Given 04/07/22 1934)  ?ipratropium (ATROVENT) nebulizer solution 0.5 mg (0.5 mg Nebulization Given 04/07/22 1934)  ? ? ?ED Course/ Medical Decision Making/ A&P ?  ?                        ?Medical Decision Making ?56 year old male who presents to the ED today with complaint of shortness of breath, dry cough, wheezing x3 days.  Received DuoNeb treatment as well as 125 Solu-Medrol with EMS.  On arrival to the ED patient is currently satting 98% on room air.  He was initially 90% with EMS.  Remainder vitals unremarkable.  He is able to speak in full sentences without difficulty.  Lungs with mild end expiratory wheezing to upper lung fields.  No history of asthma.  Does not have inhalers at home.  Is a current everyday smoker, smokes 1 to 2 packs/day.   Denies history of COPD.  We will plan for additional breathing treatment at this time as well as chest x-ray, labs, COVID testing.  We will continue to monitor in the ED. ? ?Workup overall reassuring. Pt received 2 additional breathing treatments in the ED with relief. Repeat exam with decreased wheezing. No signs of pneumonia on CXR and COVID/flu negative. Pt provided with albuterol inhaler to go home with and a short course of steroids. He is given information for [facility] for follow up. Discussed smoking cessation with pt. He is stable for discharge home.  ? ?Problems Addressed: ?SOB (shortness of breath): acute illness or injury ?Wheezing: acute illness or injury ? ?Amount and/or Complexity of Data Reviewed ?Labs: ordered. ?  Details: CBC without leukocytosis. Hgb stable without signs of anemia ?BMP without electrolyte abnormalities ?COVID and flu negative ?Radiology: ordered. ?   Details: CXR clear ? ?Risk ?Prescription drug management. ? ? ? ? ? ? ? ? ? ?Final Clinical Impression(s) / ED Diagnoses ?Final diagnoses:  ?Wheezing  ?SOB (shortness of breath)  ? ? ?Rx / DC Orders ?ED Discharge Orders   ? ?      Ordered  ?  predniSONE (DELTASONE) 10 MG tablet  Daily       ? 04/07/22 1955  ? ?  ?  ? ?  ? ? ? ?Discharge Instructions   ? ?  ?Your workup was overall reassuring in the ED today. Your chest xray and COVID/flu testing were negative.  ? ?Please pick up prescription and take as prescribed. Use the albuterol inhaler as needed (2 puffs every 4 hours).  ? ?Consider smoking cessation as this is not helping your symptoms.  ? ?You will need to follow up with your PCP for further eval. If  you do not have a PCP you can follow up with [facility] for primary care needs.  ? ?Return to the ED for any new/worsening symptoms ? ? ? ? ?  ?Eustaquio Maize, PA-C ?04/07/22 1956 ? ?  ?Lajean Saver, MD ?04/08/22 1924 ? ?

## 2022-04-07 NOTE — Discharge Instructions (Signed)
Your workup was overall reassuring in the ED today. Your chest xray and COVID/flu testing were negative.  ? ?Please pick up prescription and take as prescribed. Use the albuterol inhaler as needed (2 puffs every 4 hours).  ? ?Consider smoking cessation as this is not helping your symptoms.  ? ?You will need to follow up with your PCP for further eval. If  you do not have a PCP you can follow up with [facility] for primary care needs.  ? ?Return to the ED for any new/worsening symptoms ?

## 2022-04-07 NOTE — ED Notes (Signed)
Girlfriend Timothy Buckley [phone] wants an update immediately ?

## 2022-04-07 NOTE — ED Triage Notes (Signed)
Patient arrived via EMS from home with complaints of shortness of breath and dry cough x2 days. Initial SpO2-90% on room air per EMS, BP-144/90. Hx of asthma and chronic smoker 1-2 pack a day.Per EMS patient given duonab x1 and 125 mg solumedrol.  ?

## 2022-04-07 NOTE — ED Notes (Signed)
Radiology at bedside at this time.

## 2023-06-30 IMAGING — DX DG CHEST 1V PORT
1 series · 1 of 1 positions shown · non-contrast
Comparison: 01/18/2022

CLINICAL DATA: Cough, wheezing

EXAM:
PORTABLE CHEST 1 VIEW

[chest ap]
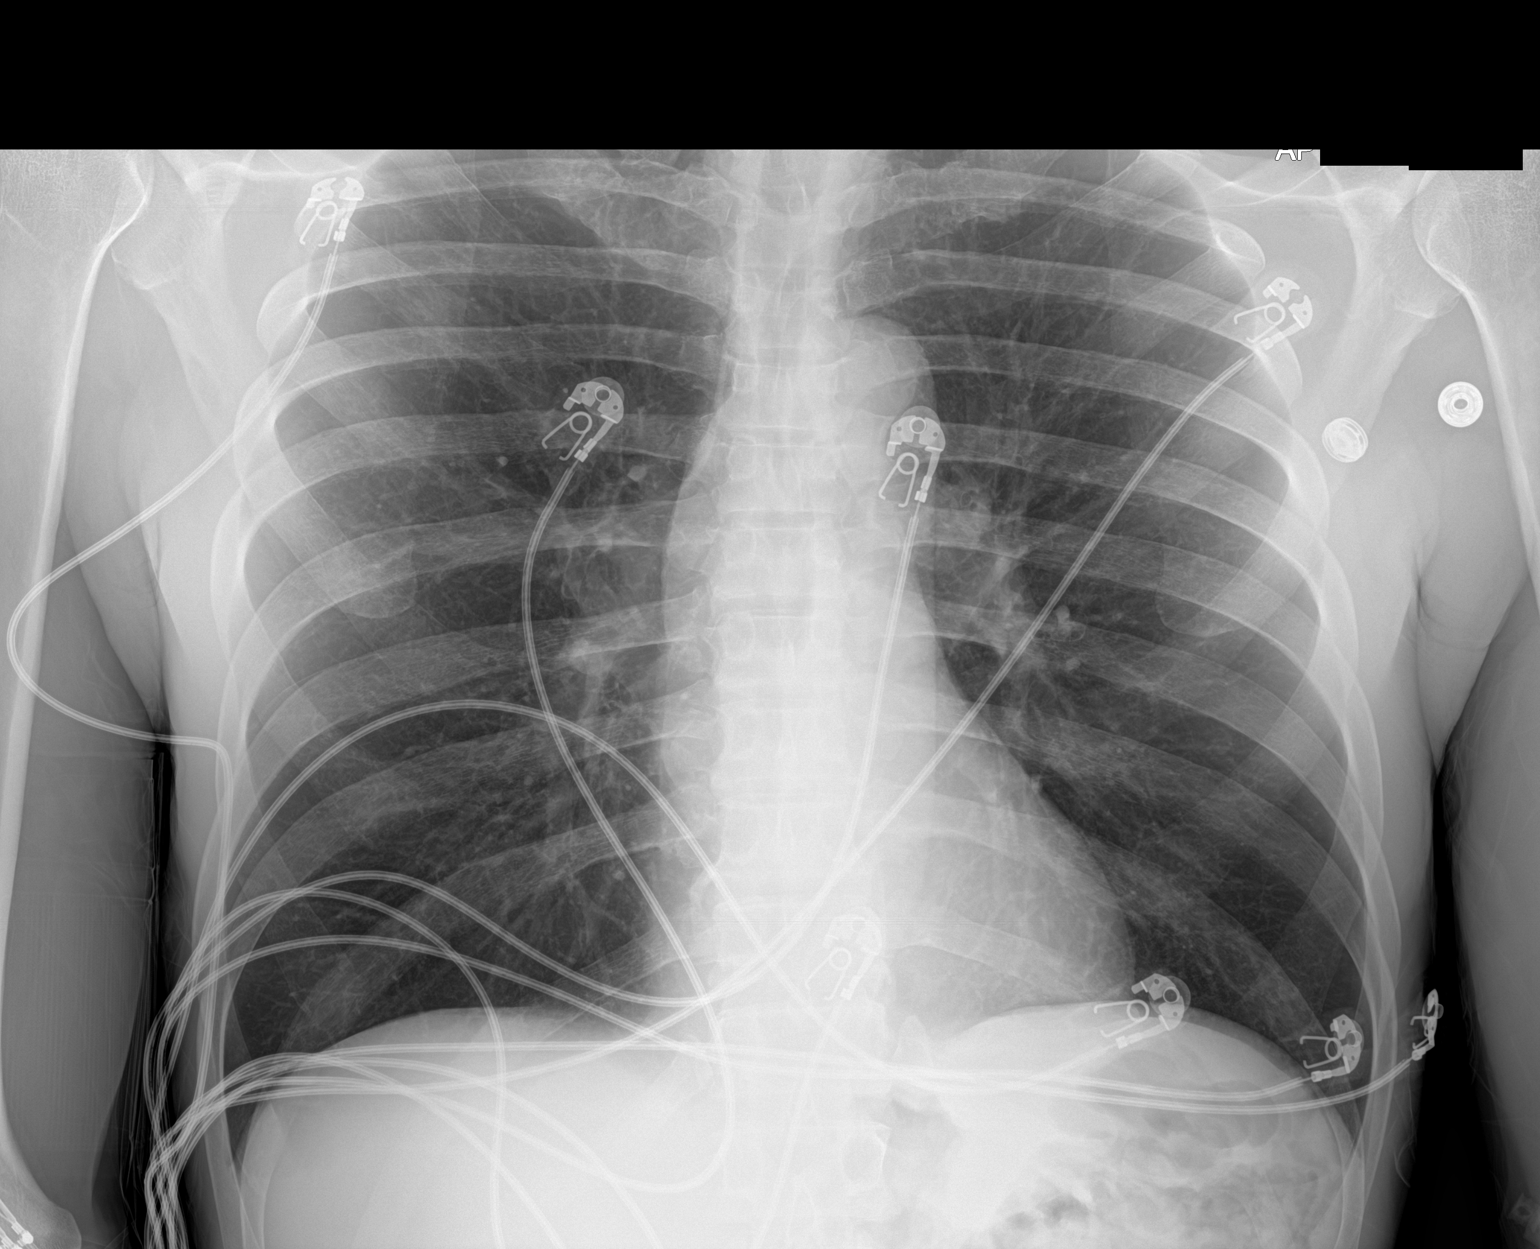

[1 of 1 positions shown; findings below may reference images not displayed]

FINDINGS: The heart size and mediastinal contours are within normal limits.
Both lungs are clear. The visualized skeletal structures are
unremarkable.
IMPRESSION: Normal study.

## 2024-12-04 ENCOUNTER — Other Ambulatory Visit: Payer: Self-pay | Admitting: Infectious Diseases

## 2024-12-04 MED ORDER — METRONIDAZOLE 500 MG PO TABS: 2000.0000 mg | ORAL_TABLET | Freq: Once | ORAL | 0 refills | Status: AC
